# Patient Record
Sex: Female | Born: 1985 | Race: White | Hispanic: No | Smoking: Current every day smoker
Health system: Southern US, Community
[De-identification: ages and names within clinical notes are randomized; demographics above are authoritative.]

## PROBLEM LIST (undated history)

## (undated) DIAGNOSIS — R350 Frequency of micturition: Secondary | ICD-10-CM

## (undated) DIAGNOSIS — Z3490 Encounter for supervision of normal pregnancy, unspecified, unspecified trimester: Secondary | ICD-10-CM

## (undated) DIAGNOSIS — N390 Urinary tract infection, site not specified: Principal | ICD-10-CM

## (undated) DIAGNOSIS — F419 Anxiety disorder, unspecified: Secondary | ICD-10-CM

## (undated) DIAGNOSIS — R319 Hematuria, unspecified: Secondary | ICD-10-CM

## (undated) DIAGNOSIS — R300 Dysuria: Secondary | ICD-10-CM

## (undated) HISTORY — DX: Anxiety disorder, unspecified: F41.9

## (undated) HISTORY — DX: Hematuria, unspecified: R31.9

## (undated) HISTORY — DX: Urinary tract infection, site not specified: N39.0

## (undated) HISTORY — PX: NO PAST SURGERIES: SHX2092

## (undated) HISTORY — DX: Frequency of micturition: R35.0

## (undated) HISTORY — DX: Dysuria: R30.0

## (undated) HISTORY — DX: Encounter for supervision of normal pregnancy, unspecified, unspecified trimester: Z34.90

---
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
## 2009-03-17 ENCOUNTER — Emergency Department (HOSPITAL_COMMUNITY): Admission: EM | Admit: 2009-03-17 | Discharge: 2009-03-17 | Payer: Self-pay | Admitting: Emergency Medicine

## 2012-02-22 NOTE — L&D Delivery Note (Signed)
Delivery Note Pt pushed well and at 2:47 AM a viable female was delivered via Vaginal, Spontaneous Delivery (Presentation: Right Occiput Anterior).  APGAR: 9, 9; weight: pending.  Infant lifted to pt's abd and dried. Cord clamped and cut by FOB. Inspection revealed intact perineum/vag. Placenta status: Intact, Spontaneous.  Cord: 3 vessels with the following complications: None.   Anesthesia: Epidural  Episiotomy: None Lacerations: None Est. Blood Loss (mL): 200  Mom to postpartum.  Baby to Couplet care / Skin to Skin.  Stacy Booth 02/02/2013, 3:30 AM

## 2012-03-29 ENCOUNTER — Other Ambulatory Visit (HOSPITAL_COMMUNITY)
Admission: RE | Admit: 2012-03-29 | Discharge: 2012-03-29 | Disposition: A | Discharge status: Home / Self Care | Payer: Federal, State, Local not specified - PPO | Source: Ambulatory Visit | Attending: Obstetrics and Gynecology | Admitting: Obstetrics and Gynecology

## 2012-03-29 ENCOUNTER — Other Ambulatory Visit: Payer: Self-pay | Admitting: Adult Health

## 2012-03-29 DIAGNOSIS — Z01419 Encounter for gynecological examination (general) (routine) without abnormal findings: Secondary | ICD-10-CM | POA: Insufficient documentation

## 2012-06-11 ENCOUNTER — Other Ambulatory Visit: Payer: Self-pay | Admitting: Obstetrics & Gynecology

## 2012-06-11 ENCOUNTER — Encounter: Payer: Self-pay | Admitting: Obstetrics & Gynecology

## 2012-06-11 ENCOUNTER — Encounter: Payer: Self-pay | Admitting: Radiology

## 2012-06-11 ENCOUNTER — Ambulatory Visit (INDEPENDENT_AMBULATORY_CARE_PROVIDER_SITE_OTHER): Payer: Federal, State, Local not specified - PPO | Admitting: Obstetrics & Gynecology

## 2012-06-11 VITALS — BP 100/66 | Ht 63.0 in | Wt 162.0 lb

## 2012-06-11 DIAGNOSIS — O3680X0 Pregnancy with inconclusive fetal viability, not applicable or unspecified: Secondary | ICD-10-CM

## 2012-06-11 DIAGNOSIS — Z3201 Encounter for pregnancy test, result positive: Secondary | ICD-10-CM

## 2012-06-11 DIAGNOSIS — Z3200 Encounter for pregnancy test, result unknown: Secondary | ICD-10-CM

## 2012-06-11 LAB — POCT URINE PREGNANCY: Preg Test, Ur: POSITIVE

## 2012-06-15 ENCOUNTER — Encounter: Payer: Self-pay | Admitting: *Deleted

## 2012-06-18 ENCOUNTER — Ambulatory Visit (INDEPENDENT_AMBULATORY_CARE_PROVIDER_SITE_OTHER): Payer: Federal, State, Local not specified - PPO

## 2012-06-18 DIAGNOSIS — N926 Irregular menstruation, unspecified: Secondary | ICD-10-CM

## 2012-06-18 DIAGNOSIS — O3680X0 Pregnancy with inconclusive fetal viability, not applicable or unspecified: Secondary | ICD-10-CM

## 2012-06-18 NOTE — Progress Notes (Signed)
U/S -single IUP with +FCA noted, FHR=123BPM, CRL c/w 6+1wks EDD-02/10/2013, cx long and closed, bilateral adnexa wnl

## 2012-06-20 ENCOUNTER — Encounter: Payer: Federal, State, Local not specified - PPO | Admitting: Advanced Practice Midwife

## 2012-06-21 LAB — US OB COMP LESS 14 WKS

## 2012-06-28 ENCOUNTER — Encounter: Payer: Self-pay | Admitting: Advanced Practice Midwife

## 2012-06-28 ENCOUNTER — Ambulatory Visit: Payer: Federal, State, Local not specified - PPO | Admitting: Advanced Practice Midwife

## 2012-06-28 VITALS — BP 118/80 | Wt 161.0 lb

## 2012-06-28 DIAGNOSIS — Z3490 Encounter for supervision of normal pregnancy, unspecified, unspecified trimester: Secondary | ICD-10-CM

## 2012-06-28 DIAGNOSIS — Z3481 Encounter for supervision of other normal pregnancy, first trimester: Secondary | ICD-10-CM

## 2012-06-28 DIAGNOSIS — Z3400 Encounter for supervision of normal first pregnancy, unspecified trimester: Secondary | ICD-10-CM

## 2012-06-28 LAB — POCT URINALYSIS DIPSTICK
Glucose, UA: NEGATIVE
Ketones, UA: NEGATIVE
Nitrite, UA: NEGATIVE
Protein, UA: NEGATIVE

## 2012-06-28 LAB — OB RESULTS CONSOLE GC/CHLAMYDIA: Gonorrhea: NEGATIVE

## 2012-06-28 LAB — CBC
Hemoglobin: 12.2 g/dL (ref 12.0–15.0)
MCHC: 33.2 g/dL (ref 30.0–36.0)
RDW: 14.2 % (ref 11.5–15.5)

## 2012-06-28 LAB — HEPATITIS B SURFACE ANTIGEN: Hepatitis B Surface Ag: NEGATIVE

## 2012-06-28 LAB — RPR

## 2012-06-28 NOTE — Progress Notes (Signed)
  Subjective:    Stacy Booth is a G2P1001 [redacted]w[redacted]d being seen today for her first obstetrical visit.  Her obstetrical history is significant for nothing.  Pregnancy history fully reviewed.  Patient reports cramping, pain in right side, headaches.  May take tylenol  FHR normal on beside ultrasound  Filed Vitals:   06/28/12 1113  BP: 118/80  Weight: 161 lb (73.029 kg)    HISTORY: OB History   Grav Para Term Preterm Abortions TAB SAB Ect Mult Living   2 1 1       1      # Outc Date GA Lbr Len/2nd Wgt Sex Del Anes PTL Lv   1 TRM 2013 [redacted]w[redacted]d  8lb(3.629kg) M SVD   Yes   2 CUR              Past Medical History  Diagnosis Date  . Medical history non-contributory    Past Surgical History  Procedure Laterality Date  . No past surgeries     Family History  Problem Relation Age of Onset  . Diabetes Mother   . Schizophrenia Brother   . Bipolar disorder Brother   . Mental retardation Brother   . Bipolar disorder Maternal Aunt   . Depression Maternal Aunt   . Schizophrenia Maternal Grandmother   . Diabetes Maternal Grandmother   . Mental retardation Maternal Grandmother   . Diabetes Maternal Grandfather      Exam                                      System: Breast:  normal appearance, no masses or tenderness   Skin: normal coloration and turgor, no rashes    Neurologic: oriented, normal, normal mood   Extremities: normal strength, tone, and muscle mass   HEENT PERRLA   Mouth/Teeth mucous membranes moist, pharynx normal without lesions   Neck supple and no masses   Cardiovascular: regular rate and rhythm   Respiratory:  appears well, vitals normal, no respiratory distress, acyanotic, normal RR   Abdomen: soft, non-tender; bowel sounds normal; no masses,  no organomegaly.  Non tender.  Pain seems more intestinal          Assessment:    Pregnancy: G2P1001 Patient Active Problem List   Diagnosis Date Noted  . Pregnant 06/28/2012    Priority: Low         Plan:     Initial labs drawn. Prenatal vitamins. Problem list reviewed and updated. Genetic Screening discussed Integrated Screen: requested.  Ultrasound discussed; fetal survey: requested.  Follow up in 5 weeks.  CRESENZO-DISHMAN,Monte Bronder 06/28/2012

## 2012-06-28 NOTE — Progress Notes (Signed)
Cramps, headache, pain in right side. New OB packet given. Consents signed.

## 2012-06-29 LAB — DRUG SCREEN, URINE, NO CONFIRMATION
Barbiturate Quant, Ur: NEGATIVE
Cocaine Metabolites: NEGATIVE
Creatinine,U: 92.3 mg/dL
Opiate Screen, Urine: NEGATIVE

## 2012-06-29 LAB — URINALYSIS
Bilirubin Urine: NEGATIVE
Glucose, UA: NEGATIVE mg/dL
Hgb urine dipstick: NEGATIVE
Ketones, ur: NEGATIVE mg/dL
Leukocytes, UA: NEGATIVE
Nitrite: NEGATIVE
Protein, ur: NEGATIVE mg/dL
Specific Gravity, Urine: 1.02 (ref 1.005–1.030)
Urobilinogen, UA: 0.2 mg/dL (ref 0.0–1.0)
pH: 5.5 (ref 5.0–8.0)

## 2012-06-29 LAB — ABO AND RH: Rh Type: POSITIVE

## 2012-06-29 LAB — GC/CHLAMYDIA PROBE AMP: CT Probe RNA: NEGATIVE

## 2012-06-29 LAB — ANTIBODY SCREEN: Antibody Screen: NEGATIVE

## 2012-07-01 LAB — URINE CULTURE: Colony Count: 9000

## 2012-08-02 ENCOUNTER — Ambulatory Visit (INDEPENDENT_AMBULATORY_CARE_PROVIDER_SITE_OTHER): Payer: Federal, State, Local not specified - PPO

## 2012-08-02 ENCOUNTER — Encounter: Payer: Self-pay | Admitting: Advanced Practice Midwife

## 2012-08-02 ENCOUNTER — Other Ambulatory Visit: Payer: Self-pay | Admitting: Advanced Practice Midwife

## 2012-08-02 ENCOUNTER — Ambulatory Visit (INDEPENDENT_AMBULATORY_CARE_PROVIDER_SITE_OTHER): Payer: Federal, State, Local not specified - PPO | Admitting: Adult Health

## 2012-08-02 VITALS — BP 140/90 | Wt 162.0 lb

## 2012-08-02 DIAGNOSIS — Z3490 Encounter for supervision of normal pregnancy, unspecified, unspecified trimester: Secondary | ICD-10-CM

## 2012-08-02 DIAGNOSIS — Z3480 Encounter for supervision of other normal pregnancy, unspecified trimester: Secondary | ICD-10-CM

## 2012-08-02 DIAGNOSIS — Z3481 Encounter for supervision of other normal pregnancy, first trimester: Secondary | ICD-10-CM

## 2012-08-02 DIAGNOSIS — Z331 Pregnant state, incidental: Secondary | ICD-10-CM

## 2012-08-02 DIAGNOSIS — Z36 Encounter for antenatal screening of mother: Secondary | ICD-10-CM

## 2012-08-02 DIAGNOSIS — Z1389 Encounter for screening for other disorder: Secondary | ICD-10-CM

## 2012-08-02 LAB — POCT URINALYSIS DIPSTICK
Leukocytes, UA: NEGATIVE
Nitrite, UA: NEGATIVE
Protein, UA: NEGATIVE

## 2012-08-02 MED ORDER — BUTALBITAL-APAP-CAFFEINE 50-325-40 MG PO TABS: 1.0000 | ORAL_TABLET | Freq: Four times a day (QID) | ORAL | Status: DC | PRN

## 2012-08-02 NOTE — Progress Notes (Signed)
U/S PERFORMED, NT SCREEN, CRL=71MM, 13W 1D, NT 1.7MM, RECENT U/S EDC= 02-10-13, CX LONG AND CLOSED. BILAT OVS SEEN, MEAS C/W DATES, NML FLUID

## 2012-08-02 NOTE — Progress Notes (Signed)
Cramping

## 2012-08-02 NOTE — Progress Notes (Signed)
Had NT/IT today.  Cx long and closed.  Still having daily, unrelenting HA's.  Tylenol q 4 hours doesn't help.  Will rx Fioricet prn.  Routine questions about pregnancy answered.  F/U in 4 weeks for LROB/2nd IT.

## 2012-08-02 NOTE — Addendum Note (Signed)
Addended by: Cyril Mourning A on: 08/02/2012 04:21 PM   Modules accepted: Orders

## 2012-08-30 ENCOUNTER — Other Ambulatory Visit: Payer: Self-pay | Admitting: Advanced Practice Midwife

## 2012-08-30 ENCOUNTER — Encounter: Payer: Self-pay | Admitting: Advanced Practice Midwife

## 2012-08-30 ENCOUNTER — Ambulatory Visit (INDEPENDENT_AMBULATORY_CARE_PROVIDER_SITE_OTHER): Payer: Federal, State, Local not specified - PPO | Admitting: Advanced Practice Midwife

## 2012-08-30 VITALS — BP 118/72 | Wt 165.0 lb

## 2012-08-30 DIAGNOSIS — Z331 Pregnant state, incidental: Secondary | ICD-10-CM

## 2012-08-30 DIAGNOSIS — Z1389 Encounter for screening for other disorder: Secondary | ICD-10-CM

## 2012-08-30 DIAGNOSIS — Z3480 Encounter for supervision of other normal pregnancy, unspecified trimester: Secondary | ICD-10-CM

## 2012-08-30 LAB — POCT URINALYSIS DIPSTICK: Protein, UA: NEGATIVE

## 2012-08-30 NOTE — Progress Notes (Signed)
2nd IT today.    No c/o at this time.  Routine questions about pregnancy answered.  F/U in 3 weeks for anatomy scan.    

## 2012-09-02 LAB — MATERNAL SCREEN, INTEGRATED #2
AFP MoM: 1.48
Calculated Gestational Age: 17.1
Crown Rump Length: 71 mm
Inhibin A Dimeric: 175 pg/mL
MSS Trisomy 18 Risk: <1:5000 {titer}
NT MoM: 1.08
Nuchal Translucency: 1.7 mm
Number of fetuses: 1
PAPP-A MoM: 1.2
hCG, Serum: 49.7 IU/mL

## 2012-09-20 ENCOUNTER — Ambulatory Visit (INDEPENDENT_AMBULATORY_CARE_PROVIDER_SITE_OTHER): Payer: Federal, State, Local not specified - PPO

## 2012-09-20 ENCOUNTER — Other Ambulatory Visit: Payer: Self-pay | Admitting: Advanced Practice Midwife

## 2012-09-20 ENCOUNTER — Encounter: Payer: Self-pay | Admitting: Advanced Practice Midwife

## 2012-09-20 ENCOUNTER — Ambulatory Visit (INDEPENDENT_AMBULATORY_CARE_PROVIDER_SITE_OTHER): Payer: Federal, State, Local not specified - PPO | Admitting: Advanced Practice Midwife

## 2012-09-20 VITALS — BP 110/70 | Wt 161.0 lb

## 2012-09-20 DIAGNOSIS — Z363 Encounter for antenatal screening for malformations: Secondary | ICD-10-CM

## 2012-09-20 DIAGNOSIS — Z1389 Encounter for screening for other disorder: Secondary | ICD-10-CM

## 2012-09-20 DIAGNOSIS — Z3490 Encounter for supervision of normal pregnancy, unspecified, unspecified trimester: Secondary | ICD-10-CM

## 2012-09-20 DIAGNOSIS — Z3480 Encounter for supervision of other normal pregnancy, unspecified trimester: Secondary | ICD-10-CM

## 2012-09-20 LAB — POCT URINALYSIS DIPSTICK
Ketones, UA: NEGATIVE
Leukocytes, UA: NEGATIVE
Nitrite, UA: NEGATIVE
Protein, UA: NEGATIVE

## 2012-09-20 NOTE — Progress Notes (Signed)
U/S(19+4wks)-active fetus, meas c/w dates, fluid wnl, no major abnl noted, cx long and closed, bilateral adnexa wnl, ant Gr 0 plac, female fetus

## 2012-09-20 NOTE — Addendum Note (Signed)
Addended by: Richardson Chiquito on: 09/20/2012 12:30 PM   Modules accepted: Orders

## 2012-09-20 NOTE — Progress Notes (Signed)
Had anatomy scan today.    No c/o at this time.  Routine questions about pregnancy answered.  F/U in 4 weeks for LROB.

## 2012-09-20 NOTE — Progress Notes (Signed)
HAD U/S TODAY. 

## 2012-10-18 ENCOUNTER — Ambulatory Visit (INDEPENDENT_AMBULATORY_CARE_PROVIDER_SITE_OTHER): Payer: Federal, State, Local not specified - PPO | Admitting: Obstetrics & Gynecology

## 2012-10-18 ENCOUNTER — Encounter: Payer: Self-pay | Admitting: Obstetrics & Gynecology

## 2012-10-18 VITALS — BP 110/60 | Wt 168.0 lb

## 2012-10-18 DIAGNOSIS — Z1389 Encounter for screening for other disorder: Secondary | ICD-10-CM

## 2012-10-18 DIAGNOSIS — Z3480 Encounter for supervision of other normal pregnancy, unspecified trimester: Secondary | ICD-10-CM

## 2012-10-18 DIAGNOSIS — Z331 Pregnant state, incidental: Secondary | ICD-10-CM

## 2012-10-18 LAB — POCT URINALYSIS DIPSTICK
Leukocytes, UA: NEGATIVE
Nitrite, UA: NEGATIVE
Protein, UA: NEGATIVE

## 2012-10-18 NOTE — Progress Notes (Signed)
BP weight and urine results all reviewed and noted. Patient reports good fetal movement, denies any bleeding and no rupture of membranes symptoms or regular contractions. Patient is without complaints. All questions were answered.  

## 2012-10-18 NOTE — Patient Instructions (Addendum)
Breastfeeding A change in hormones during your pregnancy causes growth of your breast tissue and an increase in number and size of milk ducts. The hormone prolactin allows proteins, sugars, and fats from your blood supply to make breast milk in your milk-producing glands. The hormone progesterone prevents breast milk from being released before the birth of your baby. After the birth of your baby, your progesterone level decreases allowing breast milk to be released. Thoughts of your baby, as well as his or her sucking or crying, can stimulate the release of milk from the milk-producing glands. Deciding to breastfeed (nurse) is one of the best choices you can make for you and your baby. The information that follows gives a brief review of the benefits, as well as other important skills to know about breastfeeding. BENEFITS OF BREASTFEEDING For your baby  The first milk (colostrum) helps your baby's digestive system function better.   There are antibodies in your milk that help your baby fight off infections.   Your baby has a lower incidence of asthma, allergies, and sudden infant death syndrome (SIDS).   The nutrients in breast milk are better for your baby than infant formulas.  Breast milk improves your baby's brain development.   Your baby will have less gas, colic, and constipation.  Your baby is less likely to develop other conditions, such as childhood obesity, asthma, or diabetes mellitus. For you  Breastfeeding helps develop a very special bond between you and your baby.   Breastfeeding is convenient, always available at the correct temperature, and costs nothing.   Breastfeeding helps to burn calories and helps you lose the weight gained during pregnancy.   Breastfeeding makes your uterus contract back down to normal size faster and slows bleeding following delivery.   Breastfeeding mothers have a lower risk of developing osteoporosis or breast or ovarian cancer later  in life.  BREASTFEEDING FREQUENCY  A healthy, full-term baby may breastfeed as often as every hour or space his or her feedings to every 3 hours. Breastfeeding frequency will vary from baby to baby.   Newborns should be fed no less than every 2 3 hours during the day and every 4 5 hours during the night. You should breastfeed a minimum of 8 feedings in a 24 hour period.  Awaken your baby to breastfeed if it has been 3 4 hours since the last feeding.  Breastfeed when you feel the need to reduce the fullness of your breasts or when your newborn shows signs of hunger. Signs that your baby may be hungry include:  Increased alertness or activity.  Stretching.  Movement of the head from side to side.  Movement of the head and opening of the mouth when the corner of the mouth or cheek is stroked (rooting).  Increased sucking sounds, smacking lips, cooing, sighing, or squeaking.  Hand-to-mouth movements.  Increased sucking of fingers or hands.  Fussing.  Intermittent crying.  Signs of extreme hunger will require calming and consoling before you try to feed your baby. Signs of extreme hunger may include:  Restlessness.  A loud, strong cry.  Screaming.  Frequent feeding will help you make more milk and will help prevent problems, such as sore nipples and engorgement of the breasts.  BREASTFEEDING   Whether lying down or sitting, be sure that the baby's abdomen is facing your abdomen.   Support your breast with 4 fingers under your breast and your thumb above your nipple. Make sure your fingers are well away from   your nipple and your baby's mouth.   Stroke your baby's lips gently with your finger or nipple.   When your baby's mouth is open wide enough, place all of your nipple and as much of the colored area around your nipple (areola) as possible into your baby's mouth.  More areola should be visible above his or her upper lip than below his or her lower lip.  Your  baby's tongue should be between his or her lower gum and your breast.  Ensure that your baby's mouth is correctly positioned around the nipple (latched). Your baby's lips should create a seal on your breast.  Signs that your baby has effectively latched onto your nipple include:  Tugging or sucking without pain.  Swallowing heard between sucks.  Absent click or smacking sound.  Muscle movement above and in front of his or her ears with sucking.  Your baby must suck about 2 3 minutes in order to get your milk. Allow your baby to feed on each breast as long as he or she wants. Nurse your baby until he or she unlatches or falls asleep at the first breast, then offer the second breast.  Signs that your baby is full and satisfied include:  A gradual decrease in the number of sucks or complete cessation of sucking.  Falling asleep.  Extension or relaxation of his or her body.  Retention of a small amount of milk in his or her mouth.  Letting go of your breast by himself or herself.  Signs of effective breastfeeding in you include:  Breasts that have increased firmness, weight, and size prior to feeding.  Breasts that are softer after nursing.  Increased milk volume, as well as a change in milk consistency and color by the 5th day of breastfeeding.  Breast fullness relieved by breastfeeding.  Nipples are not sore, cracked, or bleeding.  If needed, break the suction by putting your finger into the corner of your baby's mouth and sliding your finger between his or her gums. Then, remove your breast from his or her mouth.  It is common for babies to spit up a small amount after a feeding.  Babies often swallow air during feeding. This can make babies fussy. Burping your baby between breasts can help with this.  Vitamin D supplements are recommended for babies who get only breast milk.  Avoid using a pacifier during your baby's first 4 6 weeks.  Avoid supplemental feedings of  water, formula, or juice in place of breastfeeding. Breast milk is all the food your baby needs. It is not necessary for your baby to have water or formula. Your breasts will make more milk if supplemental feedings are avoided during the early weeks. HOW TO TELL WHETHER YOUR BABY IS GETTING ENOUGH BREAST MILK Wondering whether or not your baby is getting enough milk is a common concern among mothers. You can be assured that your baby is getting enough milk if:   Your baby is actively sucking and you hear swallowing.   Your baby seems relaxed and satisfied after a feeding.   Your baby nurses at least 8 12 times in a 24 hour time period.  During the first 3 5 days of age:  Your baby is wetting at least 3 5 diapers in a 24 hour period. The urine should be clear and pale yellow.  Your baby is having at least 3 4 stools in a 24 hour period. The stool should be soft and yellow.  At   5 7 days of age, your baby is having at least 3 6 stools in a 24 hour period. The stool should be seedy and yellow by 5 days of age.  Your baby has a weight loss less than 7 10% during the first 3 days of age.  Your baby does not lose weight after 3 7 days of age.  Your baby gains 4 7 ounces each week after he or she is 4 days of age.  Your baby gains weight by 5 days of age and is back to birth weight within 2 weeks. ENGORGEMENT In the first week after your baby is born, you may experience extremely full breasts (engorgement). When engorged, your breasts may feel heavy, warm, or tender to the touch. Engorgement peaks within 24 48 hours after delivery of your baby.  Engorgement may be reduced by:  Continuing to breastfeed.  Increasing the frequency of breastfeeding.  Taking warm showers or applying warm, moist heat to your breasts just before each feeding. This increases circulation and helps the milk flow.   Gently massaging your breast before and during the feedings. With your fingertips, massage from  your chest wall towards your nipple in a circular motion.   Ensuring that your baby empties at least one breast at every feeding. It also helps to start the next feeding on the opposite breast.   Expressing breast milk by hand or by using a breast pump to empty the breasts if your baby is sleepy, or not nursing well. You may also want to express milk if you are returning to work oryou feel you are getting engorged.  Ensuring your baby is latched on and positioned properly while breastfeeding. If you follow these suggestions, your engorgement should improve in 24 48 hours. If you are still experiencing difficulty, call your lactation consultant or caregiver.  CARING FOR YOURSELF Take care of your breasts.  Bathe or shower daily.   Avoid using soap on your nipples.   Wear a supportive bra. Avoid wearing underwire style bras.  Air dry your nipples for a 3 4minutes after each feeding.   Use only cotton bra pads to absorb breast milk leakage. Leaking of breast milk between feedings is normal.   Use only pure lanolin on your nipples after nursing. You do not need to wash it off before feeding your baby again. Another option is to express a few drops of breast milk and gently massage that milk into your nipples.  Continue breast self-awareness checks. Take care of yourself.  Eat healthy foods. Alternate 3 meals with 3 snacks.  Avoid foods that you notice affect your baby in a bad way.  Drink milk, fruit juice, and water to satisfy your thirst (about 8 glasses a day).   Rest often, relax, and take your prenatal vitamins to prevent fatigue, stress, and anemia.  Avoid chewing and smoking tobacco.  Avoid alcohol and drug use.  Take over-the-counter and prescribed medicine only as directed by your caregiver or pharmacist. You should always check with your caregiver or pharmacist before taking any new medicine, vitamin, or herbal supplement.  Know that pregnancy is possible while  breastfeeding. If desired, talk to your caregiver about family planning and safe birth control methods that may be used while breastfeeding. SEEK MEDICAL CARE IF:   You feel like you want to stop breastfeeding or have become frustrated with breastfeeding.  You have painful breasts or nipples.  Your nipples are cracked or bleeding.  Your breasts are red, tender,   or warm.  You have a swollen area on either breast.  You have a fever or chills.  You have nausea or vomiting.  You have drainage from your nipples.  Your breasts do not become full before feedings by the 5th day after delivery.  You feel sad and depressed.  Your baby is too sleepy to eat well.  Your baby is having trouble sleeping.   Your baby is wetting less than 3 diapers in a 24 hour period.  Your baby has less than 3 stools in a 24 hour period.  Your baby's skin or the white part of his or her eyes becomes more yellow.   Your baby is not gaining weight by 5 days of age. MAKE SURE YOU:   Understand these instructions.  Will watch your condition.  Will get help right away if you are not doing well or get worse. Document Released: 02/07/2005 Document Revised: 11/02/2011 Document Reviewed: 09/14/2011 [employer] Patient Information 2014 [employer].  

## 2012-11-16 ENCOUNTER — Ambulatory Visit (INDEPENDENT_AMBULATORY_CARE_PROVIDER_SITE_OTHER): Payer: Federal, State, Local not specified - PPO | Admitting: Obstetrics & Gynecology

## 2012-11-16 ENCOUNTER — Encounter: Payer: Self-pay | Admitting: Obstetrics & Gynecology

## 2012-11-16 ENCOUNTER — Other Ambulatory Visit: Payer: Federal, State, Local not specified - PPO

## 2012-11-16 VITALS — BP 102/60 | Wt 179.0 lb

## 2012-11-16 DIAGNOSIS — Z331 Pregnant state, incidental: Secondary | ICD-10-CM

## 2012-11-16 DIAGNOSIS — Z3483 Encounter for supervision of other normal pregnancy, third trimester: Secondary | ICD-10-CM

## 2012-11-16 DIAGNOSIS — Z3482 Encounter for supervision of other normal pregnancy, second trimester: Secondary | ICD-10-CM

## 2012-11-16 DIAGNOSIS — O99019 Anemia complicating pregnancy, unspecified trimester: Secondary | ICD-10-CM

## 2012-11-16 DIAGNOSIS — Z1389 Encounter for screening for other disorder: Secondary | ICD-10-CM

## 2012-11-16 LAB — POCT URINALYSIS DIPSTICK
Glucose, UA: NEGATIVE
Ketones, UA: NEGATIVE
Leukocytes, UA: NEGATIVE

## 2012-11-16 LAB — CBC
MCHC: 34.3 g/dL (ref 30.0–36.0)
RDW: 13.6 % (ref 11.5–15.5)

## 2012-11-16 NOTE — Progress Notes (Signed)
BP weight and urine results all reviewed and noted. Patient reports good fetal movement, denies any bleeding and no rupture of membranes symptoms or regular contractions. Patient is without complaints. All questions were answered.  

## 2012-11-16 NOTE — Addendum Note (Signed)
Addended by: Colen Darling on: 11/16/2012 11:15 AM   Modules accepted: Orders

## 2012-11-16 NOTE — Progress Notes (Signed)
For PN2 Today. 

## 2012-11-17 LAB — HIV ANTIBODY (ROUTINE TESTING W REFLEX): HIV: NONREACTIVE

## 2012-11-17 LAB — ANTIBODY SCREEN: Antibody Screen: NEGATIVE

## 2012-11-19 LAB — HSV 2 ANTIBODY, IGG: HSV 2 Glycoprotein G Ab, IgG: <0.1 IV

## 2012-12-03 ENCOUNTER — Other Ambulatory Visit: Payer: Self-pay | Admitting: Adult Health

## 2012-12-03 MED ORDER — BUTALBITAL-APAP-CAFFEINE 50-325-40 MG PO TABS: 1.0000 | ORAL_TABLET | Freq: Four times a day (QID) | ORAL | Status: DC | PRN

## 2012-12-07 ENCOUNTER — Ambulatory Visit (INDEPENDENT_AMBULATORY_CARE_PROVIDER_SITE_OTHER): Payer: Federal, State, Local not specified - PPO | Admitting: Obstetrics and Gynecology

## 2012-12-07 VITALS — BP 118/60 | Wt 184.0 lb

## 2012-12-07 DIAGNOSIS — Z3490 Encounter for supervision of normal pregnancy, unspecified, unspecified trimester: Secondary | ICD-10-CM

## 2012-12-07 DIAGNOSIS — Z331 Pregnant state, incidental: Secondary | ICD-10-CM

## 2012-12-07 DIAGNOSIS — Z1389 Encounter for screening for other disorder: Secondary | ICD-10-CM

## 2012-12-07 DIAGNOSIS — O99019 Anemia complicating pregnancy, unspecified trimester: Secondary | ICD-10-CM

## 2012-12-07 LAB — POCT URINALYSIS DIPSTICK
Blood, UA: NEGATIVE
Ketones, UA: NEGATIVE
Nitrite, UA: NEGATIVE
Protein, UA: NEGATIVE

## 2012-12-07 NOTE — Progress Notes (Signed)
Pt is 30 weeks, 5 days and has been a low risk pregnancy thus far. She is unsure of contraception after birth. Leaning towards birth control pills. Discussed different contraception options including IUD. Provide pt with pamphlet for further reading. She expresses concern over taking Excedrin migraine due to ASA content. Pt states that she has put in a request for a Fioricet renewal. Addressed pt's concerns for possible placental bleeding due to ASA intake. Will have pt come for rechecks every two weeks

## 2012-12-07 NOTE — Progress Notes (Signed)
C/o back pain

## 2012-12-20 ENCOUNTER — Encounter: Payer: Federal, State, Local not specified - PPO | Admitting: Obstetrics & Gynecology

## 2012-12-24 ENCOUNTER — Ambulatory Visit (INDEPENDENT_AMBULATORY_CARE_PROVIDER_SITE_OTHER): Payer: Federal, State, Local not specified - PPO | Admitting: Obstetrics & Gynecology

## 2012-12-24 ENCOUNTER — Encounter: Payer: Self-pay | Admitting: Obstetrics & Gynecology

## 2012-12-24 VITALS — BP 110/80 | Wt 190.0 lb

## 2012-12-24 DIAGNOSIS — O2390 Unspecified genitourinary tract infection in pregnancy, unspecified trimester: Secondary | ICD-10-CM

## 2012-12-24 DIAGNOSIS — Z1389 Encounter for screening for other disorder: Secondary | ICD-10-CM

## 2012-12-24 DIAGNOSIS — N760 Acute vaginitis: Secondary | ICD-10-CM

## 2012-12-24 DIAGNOSIS — Z3483 Encounter for supervision of other normal pregnancy, third trimester: Secondary | ICD-10-CM

## 2012-12-24 DIAGNOSIS — O99019 Anemia complicating pregnancy, unspecified trimester: Secondary | ICD-10-CM

## 2012-12-24 LAB — POCT URINALYSIS DIPSTICK
Blood, UA: NEGATIVE
Ketones, UA: NEGATIVE
Nitrite, UA: NEGATIVE
Protein, UA: NEGATIVE

## 2012-12-24 MED ORDER — METRONIDAZOLE 0.75 % VA GEL: VAGINAL | Status: DC

## 2012-12-24 NOTE — Addendum Note (Signed)
Addended by: Richardson Chiquito on: 12/24/2012 04:19 PM   Modules accepted: Orders

## 2012-12-24 NOTE — Progress Notes (Signed)
+  BV on exam  metro[facility]el x 5 nights BP weight and urine results all reviewed and noted. Patient reports good fetal movement, denies any bleeding and no rupture of membranes symptoms or regular contractions. Patient is without complaints. All questions were answered.

## 2012-12-27 ENCOUNTER — Other Ambulatory Visit: Payer: Self-pay

## 2013-01-10 ENCOUNTER — Encounter: Payer: Self-pay | Admitting: Advanced Practice Midwife

## 2013-01-10 ENCOUNTER — Ambulatory Visit (INDEPENDENT_AMBULATORY_CARE_PROVIDER_SITE_OTHER): Payer: Federal, State, Local not specified - PPO | Admitting: Advanced Practice Midwife

## 2013-01-10 VITALS — BP 130/78 | Wt 199.0 lb

## 2013-01-10 DIAGNOSIS — Z3490 Encounter for supervision of normal pregnancy, unspecified, unspecified trimester: Secondary | ICD-10-CM

## 2013-01-10 DIAGNOSIS — Z1389 Encounter for screening for other disorder: Secondary | ICD-10-CM

## 2013-01-10 DIAGNOSIS — O99019 Anemia complicating pregnancy, unspecified trimester: Secondary | ICD-10-CM

## 2013-01-10 DIAGNOSIS — Z331 Pregnant state, incidental: Secondary | ICD-10-CM

## 2013-01-10 LAB — POCT URINALYSIS DIPSTICK
Blood, UA: NEGATIVE
Nitrite, UA: NEGATIVE
Protein, UA: NEGATIVE

## 2013-01-10 NOTE — Patient Instructions (Signed)

## 2013-01-10 NOTE — Progress Notes (Signed)
Vaginal pressure two days ago since resolved. Reassured pressure is normal at this point. Denies headache, blurred vision, epigastric pain, vaginal bleeding, and contractions. Will monitor BP. Info on gestation hypertension given. F/u in 1 week for BP check.

## 2013-01-16 ENCOUNTER — Ambulatory Visit (INDEPENDENT_AMBULATORY_CARE_PROVIDER_SITE_OTHER): Payer: Federal, State, Local not specified - PPO | Admitting: Obstetrics and Gynecology

## 2013-01-16 ENCOUNTER — Encounter: Payer: Self-pay | Admitting: Obstetrics and Gynecology

## 2013-01-16 VITALS — BP 138/78 | Wt 204.0 lb

## 2013-01-16 DIAGNOSIS — Z331 Pregnant state, incidental: Secondary | ICD-10-CM

## 2013-01-16 DIAGNOSIS — Z1389 Encounter for screening for other disorder: Secondary | ICD-10-CM

## 2013-01-16 DIAGNOSIS — IMO0002 Reserved for concepts with insufficient information to code with codable children: Secondary | ICD-10-CM

## 2013-01-16 DIAGNOSIS — O99019 Anemia complicating pregnancy, unspecified trimester: Secondary | ICD-10-CM

## 2013-01-16 DIAGNOSIS — Z3490 Encounter for supervision of normal pregnancy, unspecified, unspecified trimester: Secondary | ICD-10-CM | POA: Insufficient documentation

## 2013-01-16 DIAGNOSIS — Z3483 Encounter for supervision of other normal pregnancy, third trimester: Secondary | ICD-10-CM

## 2013-01-16 DIAGNOSIS — Z3493 Encounter for supervision of normal pregnancy, unspecified, third trimester: Secondary | ICD-10-CM

## 2013-01-16 LAB — POCT URINALYSIS DIPSTICK
Blood, UA: NEGATIVE
Glucose, UA: 1
Ketones, UA: NEGATIVE

## 2013-01-16 NOTE — Patient Instructions (Signed)
Fetal Movement Counts Patient Name: __________________________________________________ Patient Due Date: ____________________ Performing a fetal movement count is highly recommended in high-risk pregnancies, but it is good for every pregnant woman to do. Your caregiver may ask you to start counting fetal movements at 28 weeks of the pregnancy. Fetal movements often increase:  After eating a full meal.  After physical activity.  After eating or drinking something sweet or cold.  At rest. Pay attention to when you feel the baby is most active. This will help you notice a pattern of your baby's sleep and wake cycles and what factors contribute to an increase in fetal movement. It is important to perform a fetal movement count at the same time each day when your baby is normally most active.  HOW TO COUNT FETAL MOVEMENTS 1. Find a quiet and comfortable area to sit or lie down on your left side. Lying on your left side provides the best blood and oxygen circulation to your baby. 2. Write down the day and time on a sheet of paper or in a journal. 3. Start counting kicks, flutters, swishes, rolls, or jabs in a 2 hour period. You should feel at least 10 movements within 2 hours. 4. If you do not feel 10 movements in 2 hours, wait 2 3 hours and count again. Look for a change in the pattern or not enough counts in 2 hours. SEEK MEDICAL CARE IF:  You feel less than 10 counts in 2 hours, tried twice.  There is no movement in over an hour.  The pattern is changing or taking longer each day to reach 10 counts in 2 hours.  You feel the baby is not moving as he or she usually does. Date: ____________ Movements: ____________ Start time: ____________ Finish time: ____________  Date: ____________ Movements: ____________ Start time: ____________ Finish time: ____________ Date: ____________ Movements: ____________ Start time: ____________ Finish time: ____________ Date: ____________ Movements: ____________  Start time: ____________ Finish time: ____________ Date: ____________ Movements: ____________ Start time: ____________ Finish time: ____________ Date: ____________ Movements: ____________ Start time: ____________ Finish time: ____________ Date: ____________ Movements: ____________ Start time: ____________ Finish time: ____________ Date: ____________ Movements: ____________ Start time: ____________ Finish time: ____________  Date: ____________ Movements: ____________ Start time: ____________ Finish time: ____________ Date: ____________ Movements: ____________ Start time: ____________ Finish time: ____________ Date: ____________ Movements: ____________ Start time: ____________ Finish time: ____________ Date: ____________ Movements: ____________ Start time: ____________ Finish time: ____________ Date: ____________ Movements: ____________ Start time: ____________ Finish time: ____________ Date: ____________ Movements: ____________ Start time: ____________ Finish time: ____________ Date: ____________ Movements: ____________ Start time: ____________ Finish time: ____________  Date: ____________ Movements: ____________ Start time: ____________ Finish time: ____________ Date: ____________ Movements: ____________ Start time: ____________ Finish time: ____________ Date: ____________ Movements: ____________ Start time: ____________ Finish time: ____________ Date: ____________ Movements: ____________ Start time: ____________ Finish time: ____________ Date: ____________ Movements: ____________ Start time: ____________ Finish time: ____________ Date: ____________ Movements: ____________ Start time: ____________ Finish time: ____________ Date: ____________ Movements: ____________ Start time: ____________ Finish time: ____________  Date: ____________ Movements: ____________ Start time: ____________ Finish time: ____________ Date: ____________ Movements: ____________ Start time: ____________ Finish time:  ____________ Date: ____________ Movements: ____________ Start time: ____________ Finish time: ____________ Date: ____________ Movements: ____________ Start time: ____________ Finish time: ____________ Date: ____________ Movements: ____________ Start time: ____________ Finish time: ____________ Date: ____________ Movements: ____________ Start time: ____________ Finish time: ____________ Date: ____________ Movements: ____________ Start time: ____________ Finish time: ____________  Date: ____________ Movements: ____________ Start time: ____________ Finish   time: ____________ Date: ____________ Movements: ____________ Start time: ____________ Finish time: ____________ Date: ____________ Movements: ____________ Start time: ____________ Finish time: ____________ Date: ____________ Movements: ____________ Start time: ____________ Finish time: ____________ Date: ____________ Movements: ____________ Start time: ____________ Finish time: ____________ Date: ____________ Movements: ____________ Start time: ____________ Finish time: ____________ Date: ____________ Movements: ____________ Start time: ____________ Finish time: ____________  Date: ____________ Movements: ____________ Start time: ____________ Finish time: ____________ Date: ____________ Movements: ____________ Start time: ____________ Finish time: ____________ Date: ____________ Movements: ____________ Start time: ____________ Finish time: ____________ Date: ____________ Movements: ____________ Start time: ____________ Finish time: ____________ Date: ____________ Movements: ____________ Start time: ____________ Finish time: ____________ Date: ____________ Movements: ____________ Start time: ____________ Finish time: ____________ Date: ____________ Movements: ____________ Start time: ____________ Finish time: ____________  Date: ____________ Movements: ____________ Start time: ____________ Finish time: ____________ Date: ____________ Movements:  ____________ Start time: ____________ Finish time: ____________ Date: ____________ Movements: ____________ Start time: ____________ Finish time: ____________ Date: ____________ Movements: ____________ Start time: ____________ Finish time: ____________ Date: ____________ Movements: ____________ Start time: ____________ Finish time: ____________ Date: ____________ Movements: ____________ Start time: ____________ Finish time: ____________ Date: ____________ Movements: ____________ Start time: ____________ Finish time: ____________  Date: ____________ Movements: ____________ Start time: ____________ Finish time: ____________ Date: ____________ Movements: ____________ Start time: ____________ Finish time: ____________ Date: ____________ Movements: ____________ Start time: ____________ Finish time: ____________ Date: ____________ Movements: ____________ Start time: ____________ Finish time: ____________ Date: ____________ Movements: ____________ Start time: ____________ Finish time: ____________ Date: ____________ Movements: ____________ Start time: ____________ Finish time: ____________ Document Released: 03/09/2006 Document Revised: 01/25/2012 Document Reviewed: 12/05/2011 [employer] Patient Information 2014 [employer].  

## 2013-01-16 NOTE — Progress Notes (Signed)
Pt states that she is having pain and pressure. Pt denies any problems or concerns at this time.

## 2013-01-16 NOTE — Progress Notes (Signed)
Fetal movement continues , no bleeding or d/c Leg swollen , reflexes 1+,

## 2013-01-17 LAB — GC/CHLAMYDIA PROBE AMP: CT Probe RNA: NEGATIVE

## 2013-01-18 LAB — STREP B DNA PROBE: GBSP: NEGATIVE

## 2013-01-25 ENCOUNTER — Encounter: Payer: Self-pay | Admitting: Obstetrics & Gynecology

## 2013-01-25 ENCOUNTER — Ambulatory Visit (INDEPENDENT_AMBULATORY_CARE_PROVIDER_SITE_OTHER): Payer: Federal, State, Local not specified - PPO | Admitting: Obstetrics & Gynecology

## 2013-01-25 VITALS — BP 130/80 | Wt 198.0 lb

## 2013-01-25 DIAGNOSIS — O99019 Anemia complicating pregnancy, unspecified trimester: Secondary | ICD-10-CM

## 2013-01-25 DIAGNOSIS — Z331 Pregnant state, incidental: Secondary | ICD-10-CM

## 2013-01-25 DIAGNOSIS — Z1389 Encounter for screening for other disorder: Secondary | ICD-10-CM

## 2013-01-25 LAB — POCT URINALYSIS DIPSTICK
Glucose, UA: NEGATIVE
Ketones, UA: NEGATIVE
Leukocytes, UA: NEGATIVE
Nitrite, UA: NEGATIVE
Protein, UA: NEGATIVE

## 2013-01-25 NOTE — Progress Notes (Signed)
BP weight and urine results all reviewed and noted. Patient reports good fetal movement, denies any bleeding and no rupture of membranes symptoms or regular contractions. Patient is without complaints. All questions were answered.  

## 2013-02-01 ENCOUNTER — Inpatient Hospital Stay (HOSPITAL_COMMUNITY)
Admission: AD | Admit: 2013-02-01 | Discharge: 2013-02-03 | DRG: 775 | Disposition: A | Discharge status: Home / Self Care | Payer: Federal, State, Local not specified - PPO | Source: Ambulatory Visit | Attending: Obstetrics & Gynecology | Admitting: Obstetrics & Gynecology

## 2013-02-01 ENCOUNTER — Encounter: Payer: Federal, State, Local not specified - PPO | Admitting: Obstetrics & Gynecology

## 2013-02-01 ENCOUNTER — Inpatient Hospital Stay (HOSPITAL_COMMUNITY): Payer: Federal, State, Local not specified - PPO | Admitting: Anesthesiology

## 2013-02-01 ENCOUNTER — Encounter (HOSPITAL_COMMUNITY): Payer: Federal, State, Local not specified - PPO | Admitting: Anesthesiology

## 2013-02-01 ENCOUNTER — Encounter (HOSPITAL_COMMUNITY): Payer: Self-pay | Admitting: *Deleted

## 2013-02-01 DIAGNOSIS — R51 Headache: Secondary | ICD-10-CM | POA: Diagnosis not present

## 2013-02-01 DIAGNOSIS — O99893 Other specified diseases and conditions complicating puerperium: Principal | ICD-10-CM | POA: Diagnosis not present

## 2013-02-01 DIAGNOSIS — Z87891 Personal history of nicotine dependence: Secondary | ICD-10-CM

## 2013-02-01 DIAGNOSIS — Z3493 Encounter for supervision of normal pregnancy, unspecified, third trimester: Secondary | ICD-10-CM

## 2013-02-01 DIAGNOSIS — Z3490 Encounter for supervision of normal pregnancy, unspecified, unspecified trimester: Secondary | ICD-10-CM

## 2013-02-01 DIAGNOSIS — IMO0001 Reserved for inherently not codable concepts without codable children: Secondary | ICD-10-CM

## 2013-02-01 LAB — ABO/RH: ABO/RH(D): A POS

## 2013-02-01 LAB — CBC
MCHC: 34.4 g/dL (ref 30.0–36.0)
MCV: 86.6 fL (ref 78.0–100.0)
Platelets: 203 10*3/uL (ref 150–400)
RBC: 3.96 MIL/uL (ref 3.87–5.11)
RDW: 14 % (ref 11.5–15.5)
WBC: 12.9 10*3/uL — ABNORMAL HIGH (ref 4.0–10.5)

## 2013-02-01 LAB — TYPE AND SCREEN
ABO/RH(D): A POS
Antibody Screen: NEGATIVE

## 2013-02-01 MED ORDER — FENTANYL 2.5 MCG/ML BUPIVACAINE 1/10 % EPIDURAL INFUSION (WH - ANES)
14.0000 mL/h | INTRAMUSCULAR | Status: DC | PRN
  Administered 2013-02-02: 14 mL/h via EPIDURAL
  Filled 2013-02-01 (×4): qty 125

## 2013-02-01 MED ORDER — DIPHENHYDRAMINE HCL 50 MG/ML IJ SOLN: 12.5000 mg | INTRAMUSCULAR | Status: DC | PRN

## 2013-02-01 MED ORDER — EPHEDRINE 5 MG/ML INJ: 10.0000 mg | INTRAVENOUS | Status: DC | PRN

## 2013-02-01 MED ORDER — OXYTOCIN BOLUS FROM INFUSION
500.0000 mL | INTRAVENOUS | Status: DC
  Administered 2013-02-02: 500 mL via INTRAVENOUS

## 2013-02-01 MED ORDER — CITRIC ACID-SODIUM CITRATE 334-500 MG/5ML PO SOLN
30.0000 mL | ORAL | Status: DC | PRN
  Administered 2013-02-01: 30 mL via ORAL
  Filled 2013-02-01: qty 15

## 2013-02-01 MED ORDER — ACETAMINOPHEN 325 MG PO TABS
650.0000 mg | ORAL_TABLET | ORAL | Status: DC | PRN
  Administered 2013-02-01: 650 mg via ORAL
  Filled 2013-02-01: qty 2

## 2013-02-01 MED ORDER — ONDANSETRON HCL 4 MG/2ML IJ SOLN: 4.0000 mg | Freq: Four times a day (QID) | INTRAMUSCULAR | Status: DC | PRN

## 2013-02-01 MED ORDER — LACTATED RINGERS IV SOLN
INTRAVENOUS | Status: DC
  Administered 2013-02-01 (×3): via INTRAVENOUS

## 2013-02-01 MED ORDER — IBUPROFEN 600 MG PO TABS: 600.0000 mg | ORAL_TABLET | Freq: Four times a day (QID) | ORAL | Status: DC | PRN

## 2013-02-01 MED ORDER — OXYTOCIN 40 UNITS IN LACTATED RINGERS INFUSION - SIMPLE MED: 62.5000 mL/h | INTRAVENOUS | Status: DC

## 2013-02-01 MED ORDER — LIDOCAINE HCL (PF) 1 % IJ SOLN
30.0000 mL | INTRAMUSCULAR | Status: DC | PRN
  Filled 2013-02-01: qty 30

## 2013-02-01 MED ORDER — PHENYLEPHRINE 40 MCG/ML (10ML) SYRINGE FOR IV PUSH (FOR BLOOD PRESSURE SUPPORT)
80.0000 ug | PREFILLED_SYRINGE | INTRAVENOUS | Status: DC | PRN
  Filled 2013-02-01: qty 10

## 2013-02-01 MED ORDER — LIDOCAINE HCL (PF) 1 % IJ SOLN
INTRAMUSCULAR | Status: DC | PRN
  Administered 2013-02-01 (×2): 4 mL

## 2013-02-01 MED ORDER — OXYCODONE-ACETAMINOPHEN 5-325 MG PO TABS
1.0000 | ORAL_TABLET | ORAL | Status: DC | PRN
  Administered 2013-02-02: 1 via ORAL
  Filled 2013-02-01: qty 1

## 2013-02-01 MED ORDER — LACTATED RINGERS IV SOLN
500.0000 mL | Freq: Once | INTRAVENOUS | Status: AC
  Administered 2013-02-01: 500 mL via INTRAVENOUS

## 2013-02-01 MED ORDER — PHENYLEPHRINE 40 MCG/ML (10ML) SYRINGE FOR IV PUSH (FOR BLOOD PRESSURE SUPPORT): 80.0000 ug | PREFILLED_SYRINGE | INTRAVENOUS | Status: DC | PRN

## 2013-02-01 MED ORDER — TERBUTALINE SULFATE 1 MG/ML IJ SOLN: 0.2500 mg | Freq: Once | INTRAMUSCULAR | Status: AC | PRN

## 2013-02-01 MED ORDER — FLEET ENEMA 7-19 GM/118ML RE ENEM: 1.0000 | ENEMA | RECTAL | Status: DC | PRN

## 2013-02-01 MED ORDER — LACTATED RINGERS IV SOLN: 500.0000 mL | INTRAVENOUS | Status: DC | PRN

## 2013-02-01 MED ORDER — EPHEDRINE 5 MG/ML INJ
10.0000 mg | INTRAVENOUS | Status: DC | PRN
  Filled 2013-02-01: qty 4

## 2013-02-01 MED ORDER — FENTANYL 2.5 MCG/ML BUPIVACAINE 1/10 % EPIDURAL INFUSION (WH - ANES)
INTRAMUSCULAR | Status: DC | PRN
  Administered 2013-02-01: 14 mL/h via EPIDURAL
  Administered 2013-02-01: 20:00:00

## 2013-02-01 MED ORDER — OXYTOCIN 40 UNITS IN LACTATED RINGERS INFUSION - SIMPLE MED
1.0000 m[IU]/min | INTRAVENOUS | Status: DC
  Administered 2013-02-01: 2 m[IU]/min via INTRAVENOUS
  Filled 2013-02-01: qty 1000

## 2013-02-01 NOTE — Progress Notes (Signed)
Stacy Booth is a 27 y.o. G2P1001 at [redacted]w[redacted]d admitted for active labor, rupture of membranes  Subjective: She is comfortable on epidural and not feeling much pressure or pain with contractions. She complains of a mild headache that started after epidural. No vision changes, dizziness.  Objective: BP 122/58  Pulse 95  Temp(Src) 98.1 F (36.7 C) (Oral)  Resp 20  Ht 5\' 3"  (1.6 m)  Wt 89.812 kg (198 lb)  BMI 35.08 kg/m2  SpO2 98%   FHT:  FHR: 140 bpm, variability: moderate,  accelerations:  Present,  decelerations:  Absent UC:   regular, every 2-4 minutes SVE:   Dilation: 8 Effacement (%): 90 Station: -1 Exam by:: Anuar Walgren, MD  Labs: Lab Results  Component Value Date   WBC 12.9* 02/01/2013   HGB 11.8* 02/01/2013   HCT 34.3* 02/01/2013   MCV 86.6 02/01/2013   PLT 203 02/01/2013    Assessment / Plan: Spontaneous labor, progressing normally  Labor: Progressing normally Preeclampsia:  n/a Fetal Wellbeing:  Category I Pain Control:  Epidural I/D:  GBS neg Anticipated MOD:  NSVD  Jamerius Boeckman, Jearld Lesch 02/01/2013, 6:47 PM

## 2013-02-01 NOTE — H&P (Signed)
Stacy Booth is a 27 y.o. female G2P1001 with IUP at [redacted]w[redacted]d presenting for onset of labor. Pt states she has been having regular, every 5 minutes contractions since 0700, associated with none vaginal bleeding.  Membranes are spontaneously ruptured since 0600, clear fluid, sterile speculum exam with positive pooling and cough test, with active fetal movement.  Denies headache, vision changes, increased swelling of limbs/face, RUQ pain, chest pain, shortness of breath, palpitations, nausea/vomiting. Last ate last night, had some water prior to coming to [facility]. [facility]  Prenatal History/Complications: None  Past Medical History: Past Medical History  Diagnosis Date  . Medical history non-contributory     Past Surgical History: Past Surgical History  Procedure Laterality Date  . No past surgeries      Obstetrical History: OB History   Grav Para Term Preterm Abortions TAB SAB Ect Mult Living   2 1 1       1       Social History: History   Social History  . Marital Status: Single    Spouse Name: N/A    Number of Children: N/A  . Years of Education: N/A   Social History Main Topics  . Smoking status: Former Smoker    Types: Cigarettes  . Smokeless tobacco: Never Used  . Alcohol Use: No  . Drug Use: No  . Sexual Activity: Yes    Birth Control/ Protection: None   Other Topics Concern  . None   Social History Narrative  . None    Family History: Family History  Problem Relation Age of Onset  . Diabetes Mother   . Schizophrenia Brother   . Bipolar disorder Brother   . Mental retardation Brother   . Bipolar disorder Maternal Aunt   . Depression Maternal Aunt   . Schizophrenia Maternal Grandmother   . Diabetes Maternal Grandmother   . Mental retardation Maternal Grandmother   . Diabetes Maternal Grandfather     Allergies: No Known Allergies  Prescriptions prior to admission  Medication Sig Dispense Refill  . acetaminophen (TYLENOL) 325 MG  tablet Take 650 mg by mouth 2 (two) times daily.      . Prenatal Vit-Fe Fumarate-FA (MULTIVITAMIN-PRENATAL) 27-0.8 MG TABS Take 1 tablet by mouth daily at 12 noon.         Review of Systems  All systems reviewed and negative except as stated in HPI  Blood pressure 128/70, pulse 88, temperature 98 F (36.7 C), temperature source Oral, resp. rate 20, height 5\' 3"  (1.6 m), weight 89.812 kg (198 lb). General appearance: alert, cooperative and mild distress Lungs: clear to auscultation bilaterally Heart: regular rate and rhythm Abdomen: soft, non-tender; bowel sounds normal Pelvic: external exam within normal limits, + pooling with mild bloody show Extremities: Homans sign is negative, no sign of DVT DTR's 2+ Presentation: cephalic Fetal monitoringBaseline: 145 bpm, Variability: Good {> 6 bpm), Accelerations: Reactive and Decelerations: Absent Uterine activityFrequency: Every 3-5 minutes, Duration: 45-60 seconds and Intensity: moderate Dilation: 5 Effacement (%): 80 Station: -2 Exam by:: Shanda Bumps Pior / Sharen Counter, CNM   Prenatal labs: ABO, Rh: A/POS/-- (05/08 1151) Antibody: NEG (09/26 0913) Rubella:  Immune RPR: NON REAC (09/26 0913)  HBsAg: NEGATIVE (05/08 1151)  HIV: NON REACTIVE (09/26 0913)  GBS: NEGATIVE (11/26 1109)  1 hr Glucola: 81 Genetic screening  Normal Anatomy US Normal  Assessment: Stacy Booth is a 27 y.o. G2P1001 with an IUP at [redacted]w[redacted]d presenting for SROM and active labor at  term  Plan: Admit to L&D for active labor at term A+, GBS -, Rubella immune  Planning for epidural  Pior, Jearld Lesch, MD 02/01/2013, 11:59 AM  I have seen this patient and agree with the above resident's note.  LEFTWICH-KIRBY, Rosella Crandell Certified Nurse-Midwife

## 2013-02-01 NOTE — Progress Notes (Signed)
Stacy Booth is a 27 y.o. G2P1001 at [redacted]w[redacted]d admitted for active labor, rupture of membranes  Subjective: Pt now has epidural and only feels some pressure with contractions. Complains of some itching, but okay without medication for now. No headache, vision changes, swelling face/hands.  Objective: BP 138/67  Pulse 90  Temp(Src) 97.9 F (36.6 C) (Axillary)  Resp 18  Ht 5\' 3"  (1.6 m)  Wt 89.812 kg (198 lb)  BMI 35.08 kg/m2  SpO2 98%    FHT:  FHR: 135 bpm, variability: moderate,  accelerations:  Abscent,  decelerations:  Absent UC:   regular, every 5-6 minutes SVE:   Dilation: 5.5 Effacement (%): 80 Station: -2;-1 Exam by:: Renaldo Harrison, RN   Labs: Lab Results  Component Value Date   WBC 12.9* 02/01/2013   HGB 11.8* 02/01/2013   HCT 34.3* 02/01/2013   MCV 86.6 02/01/2013   PLT 203 02/01/2013    Assessment / Plan: Spontaneous labor, progressing normally  Labor: Progressing normally Preeclampsia:  no signs/symptoms, will continue monitoring BP Fetal Wellbeing:  Category I Pain Control:  Epidural I/D:  n/a Anticipated MOD:  NSVD  Dalilah Curlin, Jearld Lesch 02/01/2013, 3:02 PM

## 2013-02-01 NOTE — Anesthesia Preprocedure Evaluation (Signed)
Anesthesia Evaluation  Patient identified by MRN, date of birth, ID band Patient awake    Reviewed: Allergy & Precautions, H&P , Patient's Chart, lab work & pertinent test results  Airway Mallampati: III TM Distance: >3 FB Neck ROM: Full    Dental no notable dental hx. (+) Teeth Intact   Pulmonary former smoker,  breath sounds clear to auscultation  Pulmonary exam normal       Cardiovascular negative cardio ROS  Rhythm:Regular Rate:Normal     Neuro/Psych negative neurological ROS  negative psych ROS   GI/Hepatic negative GI ROS, Neg liver ROS,   Endo/Other  Obesity  Renal/GU negative Renal ROS  negative genitourinary   Musculoskeletal negative musculoskeletal ROS (+)   Abdominal (+) + obese,   Peds  Hematology negative hematology ROS (+)   Anesthesia Other Findings   Reproductive/Obstetrics (+) Pregnancy                           Anesthesia Physical Anesthesia Plan  ASA: II  Anesthesia Plan: Epidural   Post-op Pain Management:    Induction:   Airway Management Planned: Natural Airway  Additional Equipment:   Intra-op Plan:   Post-operative Plan:   Informed Consent: I have reviewed the patients History and Physical, chart, labs and discussed the procedure including the risks, benefits and alternatives for the proposed anesthesia with the patient or authorized representative who has indicated his/her understanding and acceptance.   Dental advisory given  Plan Discussed with: Anesthesiologist  Anesthesia Plan Comments:         Anesthesia Quick Evaluation

## 2013-02-01 NOTE — MAU Note (Signed)
Patient presents with complaints of contractons since 0600 and ? Rupture of membranes at 0700 today.

## 2013-02-01 NOTE — Anesthesia Procedure Notes (Signed)
Epidural Patient location during procedure: OB Start time: 02/01/2013 1:53 PM  Staffing Anesthesiologist: Guled Gahan A. Performed by: anesthesiologist   Preanesthetic Checklist Completed: patient identified, site marked, surgical consent, pre-op evaluation, timeout performed, IV checked, risks and benefits discussed and monitors and equipment checked  Epidural Patient position: sitting Prep: site prepped and draped and DuraPrep Patient monitoring: continuous pulse ox and blood pressure Approach: midline Injection technique: LOR air  Needle:  Needle type: Tuohy  Needle gauge: 17 G Needle length: 9 cm and 9 Needle insertion depth: 5 cm cm Catheter type: closed end flexible Catheter size: 19 Gauge Catheter at skin depth: 10 cm Test dose: negative and Other  Assessment Events: blood not aspirated, injection not painful, no injection resistance, negative IV test and no paresthesia  Additional Notes Patient identified. Risks and benefits discussed including failed block, incomplete  Pain control, post dural puncture headache, nerve damage, paralysis, blood pressure Changes, nausea, vomiting, reactions to medications-both toxic and allergic and post Partum back pain. All questions were answered. Patient expressed understanding and wished to proceed. Sterile technique was used throughout procedure. Epidural site was Dressed with sterile barrier dressing. No paresthesias, signs of intravascular injection Or signs of intrathecal spread were encountered.  Patient was more comfortable after the epidural was dosed. Please see RN's note for documentation of vital signs and FHR which are stable.

## 2013-02-02 ENCOUNTER — Encounter (HOSPITAL_COMMUNITY): Payer: Self-pay | Admitting: *Deleted

## 2013-02-02 DIAGNOSIS — O99893 Other specified diseases and conditions complicating puerperium: Secondary | ICD-10-CM

## 2013-02-02 DIAGNOSIS — R51 Headache: Secondary | ICD-10-CM

## 2013-02-02 DIAGNOSIS — O9989 Other specified diseases and conditions complicating pregnancy, childbirth and the puerperium: Secondary | ICD-10-CM

## 2013-02-02 DIAGNOSIS — Z87891 Personal history of nicotine dependence: Secondary | ICD-10-CM

## 2013-02-02 MED ORDER — IBUPROFEN 600 MG PO TABS
600.0000 mg | ORAL_TABLET | Freq: Four times a day (QID) | ORAL | Status: DC
  Administered 2013-02-02 – 2013-02-03 (×6): 600 mg via ORAL
  Filled 2013-02-02 (×6): qty 1

## 2013-02-02 MED ORDER — ZOLPIDEM TARTRATE 5 MG PO TABS: 5.0000 mg | ORAL_TABLET | Freq: Every evening | ORAL | Status: DC | PRN

## 2013-02-02 MED ORDER — SENNOSIDES-DOCUSATE SODIUM 8.6-50 MG PO TABS
2.0000 | ORAL_TABLET | ORAL | Status: DC
  Administered 2013-02-02: 2 via ORAL
  Filled 2013-02-02: qty 2

## 2013-02-02 MED ORDER — DIBUCAINE 1 % RE OINT
1.0000 "application " | TOPICAL_OINTMENT | RECTAL | Status: DC | PRN
  Filled 2013-02-02: qty 28

## 2013-02-02 MED ORDER — ONDANSETRON HCL 4 MG/2ML IJ SOLN: 4.0000 mg | INTRAMUSCULAR | Status: DC | PRN

## 2013-02-02 MED ORDER — DIPHENHYDRAMINE HCL 25 MG PO CAPS: 25.0000 mg | ORAL_CAPSULE | Freq: Four times a day (QID) | ORAL | Status: DC | PRN

## 2013-02-02 MED ORDER — LANOLIN HYDROUS EX OINT: TOPICAL_OINTMENT | CUTANEOUS | Status: DC | PRN

## 2013-02-02 MED ORDER — OXYCODONE-ACETAMINOPHEN 5-325 MG PO TABS
1.0000 | ORAL_TABLET | ORAL | Status: DC | PRN
  Administered 2013-02-02: 2 via ORAL
  Administered 2013-02-02: 1 via ORAL
  Administered 2013-02-02 (×2): 2 via ORAL
  Administered 2013-02-03: 1 via ORAL
  Administered 2013-02-03: 2 via ORAL
  Filled 2013-02-02: qty 2
  Filled 2013-02-02 (×2): qty 1
  Filled 2013-02-02: qty 2
  Filled 2013-02-02: qty 1
  Filled 2013-02-02: qty 2

## 2013-02-02 MED ORDER — TETANUS-DIPHTH-ACELL PERTUSSIS 5-2.5-18.5 LF-MCG/0.5 IM SUSP
0.5000 mL | Freq: Once | INTRAMUSCULAR | Status: AC
  Administered 2013-02-03: 0.5 mL via INTRAMUSCULAR
  Filled 2013-02-02: qty 0.5

## 2013-02-02 MED ORDER — ONDANSETRON HCL 4 MG PO TABS: 4.0000 mg | ORAL_TABLET | ORAL | Status: DC | PRN

## 2013-02-02 MED ORDER — BENZOCAINE-MENTHOL 20-0.5 % EX AERO
1.0000 "application " | INHALATION_SPRAY | CUTANEOUS | Status: DC | PRN
  Filled 2013-02-02: qty 56

## 2013-02-02 MED ORDER — SIMETHICONE 80 MG PO CHEW: 80.0000 mg | CHEWABLE_TABLET | ORAL | Status: DC | PRN

## 2013-02-02 MED ORDER — PRENATAL MULTIVITAMIN CH
1.0000 | ORAL_TABLET | Freq: Every day | ORAL | Status: DC
  Administered 2013-02-02 – 2013-02-03 (×2): 1 via ORAL
  Filled 2013-02-02 (×2): qty 1

## 2013-02-02 MED ORDER — WITCH HAZEL-GLYCERIN EX PADS: 1.0000 "application " | MEDICATED_PAD | CUTANEOUS | Status: DC | PRN

## 2013-02-02 NOTE — Progress Notes (Signed)
Stacy Booth is a 27 y.o. G2P1001 at [redacted]w[redacted]d   Subjective: Comfortable with epidural; occ mild pressure  Objective: BP 125/60  Pulse 89  Temp(Src) 98.2 F (36.8 C) (Oral)  Resp 16  Ht 5\' 3"  (1.6 m)  Wt 89.812 kg (198 lb)  BMI 35.08 kg/m2  SpO2 98%      FHT:  FHR: 130s bpm, variability: moderate,  accelerations:  Present,  decelerations:  Absent UC:   irregular, every 3-5 minutes with Pitocin at 8 mu/min (being increased as freq as possible)- MVUs 85 mmhg SVE:   Dilation: 8 Effacement (%): 90 Station: -1 Exam by:: Dr.Dove- cx not reexamined due to inadequate MVUs  Labs: Lab Results  Component Value Date   WBC 12.9* 02/01/2013   HGB 11.8* 02/01/2013   HCT 34.3* 02/01/2013   MCV 86.6 02/01/2013   PLT 203 02/01/2013    Assessment / Plan: Active labor- inadequate ctx  Will continue to increase Pitocin to achieve adequate labor Plan to reexamine after MVUs adequate x 1-2 hrs   SHAW, KIMBERLY 02/02/2013, 12:33 AM

## 2013-02-02 NOTE — Anesthesia Postprocedure Evaluation (Signed)
  Anesthesia Post-op Note  Patient: Stacy Booth  Procedure(s) Performed: * No procedures listed *  Patient Location: Mother/Baby  Anesthesia Type:Epidural  Level of Consciousness: awake  Airway and Oxygen Therapy: Patient Spontanous Breathing  Post-op Pain: mild  Post-op Assessment: Patient's Cardiovascular Status Stable and Respiratory Function Stable  Post-op Vital Signs: stable  Complications: No apparent anesthesia complications

## 2013-02-03 MED ORDER — OXYCODONE-ACETAMINOPHEN 5-325 MG PO TABS: 1.0000 | ORAL_TABLET | ORAL | Status: DC | PRN

## 2013-02-03 MED ORDER — IBUPROFEN 600 MG PO TABS: 600.0000 mg | ORAL_TABLET | Freq: Four times a day (QID) | ORAL | Status: DC | PRN

## 2013-02-03 NOTE — Progress Notes (Signed)
Given Rx for 20 Percocet, pt has been requiring Rx for ADL while in hospital.

## 2013-02-03 NOTE — Lactation Note (Addendum)
This note was copied from the chart of Boy Hildur Bayer. Lactation Consultation Note  Patient Name: Boy Jule Whitsel [id number] Date: 02/03/2013 Reason for consult: Initial assessment Per mom breast fed 1st baby 16 months , and breast feeding is going well with this baby . Baby presently latched with depth and mom comfortable. Baby has been consistent with feedings. Noted multiply swallows. Reviewed sore nipple and engorgement prevention and tx , referring to baby  and me booklet [id number]. Mom aware of the [facility] and the [facility] O/P services.    Maternal Data Formula Feeding for Exclusion: No Has patient been taught Hand Expression?: Yes Does the patient have breastfeeding experience prior to this delivery?: Yes  Feeding Feeding Type:  (baby latched ) Length of feed: 15 min  LATCH Score/Interventions                Intervention(s): Breastfeeding basics reviewed     Lactation Tools Discussed/Used Tools:  (per mom has a DEBP at home ) [facility] Program: Yes (per mom [place] ) Pump Review: Setup, frequency, and cleaning;Milk Storage   Consult Status Consult Status: Complete    Kathrin Greathouse 02/03/2013, 1:43 PM

## 2013-02-03 NOTE — Discharge Summary (Signed)
Obstetric Discharge Summary Reason for Admission: onset of labor and rupture of membranes Prenatal Procedures: ultrasound Intrapartum Procedures: spontaneous vaginal delivery Postpartum Procedures: none Complications-Operative and Postpartum: none Eating, drinking, voiding, ambulating well.  +flatus.  Lochia and pain wnl.  Has occasional pain that radiates to tailbone. Cramping when BF. Denies dizziness, lightheadedness, or sob. No complaints. Desires d/c today.   Hemoglobin  Date Value Range Status  02/01/2013 11.8* 12.0 - 15.0 g/dL Final     HCT  Date Value Range Status  02/01/2013 34.3* 36.0 - 46.0 % Final    Physical Exam:  General: alert, cooperative and no distress Lochia: appropriate Uterine Fundus: firm Incision: n/a DVT Evaluation: No evidence of DVT seen on physical exam. Negative Homan's sign. No cords or calf tenderness. No significant calf/ankle edema.  Discharge Diagnoses: Term Pregnancy-delivered  Discharge Information: Date: 02/03/2013 Activity: pelvic rest Diet: routine Medications: PNV and Ibuprofen Condition: stable Instructions: refer to practice specific booklet Discharge to: home Follow-up Information   Schedule an appointment as soon as possible for a visit with [facility]. (ASAP for your son's circumcision. 4-6 weeks for your postpartum visit)    Specialty:  Obstetrics and Gynecology   Contact information:   [address] [place] [address] [phone]      Newborn Data: Live born female  Birth Weight: 9 lb 4.2 oz (4200 g) APGAR: 9, 9  Home with mother. Breastfeeding, plans micronor for contraception, circumcision at FT To use donut pillow if needed for coccyx pain.   Marge Duncans 02/03/2013, 7:30 AM

## 2013-02-03 NOTE — Discharge Summary (Signed)
Attestation of Attending Supervision of Advanced Practitioner (PA/CNM/NP): Evaluation and management procedures were performed by the Advanced Practitioner under my supervision and collaboration.  I have reviewed the Advanced Practitioner's note and chart, and I agree with the management and plan.  Anagabriela Jokerst, MD, FACOG Attending Obstetrician & Gynecologist Faculty Practice, [facility]  

## 2013-03-18 ENCOUNTER — Encounter: Payer: Self-pay | Admitting: Women's Health

## 2013-03-18 ENCOUNTER — Ambulatory Visit (INDEPENDENT_AMBULATORY_CARE_PROVIDER_SITE_OTHER): Payer: Federal, State, Local not specified - PPO | Admitting: Women's Health

## 2013-03-18 VITALS — BP 124/70 | Ht 63.0 in | Wt 174.0 lb

## 2013-03-18 DIAGNOSIS — Z309 Encounter for contraceptive management, unspecified: Secondary | ICD-10-CM

## 2013-03-18 DIAGNOSIS — Z3202 Encounter for pregnancy test, result negative: Secondary | ICD-10-CM

## 2013-03-18 DIAGNOSIS — Z3480 Encounter for supervision of other normal pregnancy, unspecified trimester: Secondary | ICD-10-CM

## 2013-03-18 LAB — POCT URINE PREGNANCY: Preg Test, Ur: NEGATIVE

## 2013-03-18 MED ORDER — NORETHINDRONE 0.35 MG PO TABS: 1.0000 | ORAL_TABLET | Freq: Every day | ORAL | Status: DC

## 2013-03-18 NOTE — Patient Instructions (Signed)
Norethindrone tablets (contraception)- Micronor What is this medicine? NORETHINDRONE (nor eth IN drone) is an oral contraceptive. The product contains a female hormone known as a progestin. It is used to prevent pregnancy. This medicine may be used for other purposes; ask your health care provider or pharmacist if you have questions. COMMON BRAND NAME(S): Camila, [place] , [place], [place], [place] , [place], Nor-QD, Nora-BE, Orth[facility]ronor What should I tell my health care provider before I take this medicine? They need to know if you have any of these conditions: -blood vessel disease or blood clots -breast, cervical, or vaginal cancer -diabetes -heart disease -kidney disease -liver disease -mental depression -migraine -seizures -stroke -vaginal bleeding -an unusual or allergic reaction to norethindrone, other medicines, foods, dyes, or preservatives -pregnant or trying to get pregnant -breast-feeding How should I use this medicine? Take this medicine by mouth with a glass of water. You may take it with or without food. Follow the directions on the prescription label. Take this medicine at the same time each day and in the order directed on the package. Do not take your medicine more often than directed. Contact your pediatrician regarding the use of this medicine in children. Special care may be needed. This medicine has been used in female children who have started having menstrual periods. A patient package insert for the product will be given with each prescription and refill. Read this sheet carefully each time. The sheet may change frequently. Overdosage: If you think you have taken too much of this medicine contact a poison control center or emergency room at once. NOTE: This medicine is only for you. Do not share this medicine with others. What if I miss a dose? Try not to miss a dose. Every time you miss a dose or take a dose late your chance of pregnancy increases. When 1 pill  is missed (even if only 3 hours late), take the missed pill as soon as possible and continue taking a pill each day at the regular time (use a back up method of birth control for the next 48 hours). If more than 1 dose is missed, use an additional birth control method for the rest of your pill pack until menses occurs. Contact your health care professional if more than 1 dose has been missed. What may interact with this medicine? Do not take this medicine with any of the following medications: -amprenavir or fosamprenavir -bosentan This medicine may also interact with the following medications: -antibiotics or medicines for infections, especially rifampin, rifabutin, rifapentine, and griseofulvin, and possibly penicillins or tetracyclines -aprepitant -barbiturate medicines, such as phenobarbital -carbamazepine -felbamate -modafinil -oxcarbazepine -phenytoin -ritonavir or other medicines for HIV infection or AIDS -St. John's wort -topiramate This list may not describe all possible interactions. Give your health care provider a list of all the medicines, herbs, non-prescription drugs, or dietary supplements you use. Also tell them if you smoke, drink alcohol, or use illegal drugs. Some items may interact with your medicine. What should I watch for while using this medicine? Visit your doctor or health care professional for regular checks on your progress. You will need a regular breast and pelvic exam and Pap smear while on this medicine. Use an additional method of birth control during the first cycle that you take these tablets. If you have any reason to think you are pregnant, stop taking this medicine right away and contact your doctor or health care professional. If you are taking this medicine for hormone related problems, it may take  several cycles of use to see improvement in your condition. This medicine does not protect you against HIV infection (AIDS) or any other sexually transmitted  diseases. What side effects may I notice from receiving this medicine? Side effects that you should report to your doctor or health care professional as soon as possible: -breast tenderness or discharge -pain in the abdomen, chest, groin or leg -severe headache -skin rash, itching, or hives -sudden shortness of breath -unusually weak or tired -vision or speech problems -yellowing of skin or eyes Side effects that usually do not require medical attention (report to your doctor or health care professional if they continue or are bothersome): -changes in sexual desire -change in menstrual flow -facial hair growth -fluid retention and swelling -headache -irritability -nausea -weight gain or loss This list may not describe all possible side effects. Call your doctor for medical advice about side effects. You may report side effects to FDA at 1-800-FDA-1088. Where should I keep my medicine? Keep out of the reach of children. Store at room temperature between 15 and 30 degrees C (59 and 86 degrees F). Throw away any unused medicine after the expiration date. NOTE: This sheet is a summary. It may not cover all possible information. If you have questions about this medicine, talk to your doctor, pharmacist, or health care provider.  2014, Elsevier/Gold Standard. (2011-10-28 16:41:35)

## 2013-03-18 NOTE — Progress Notes (Signed)
Patient ID: Stacy Booth, female   DOB: November 04, 1985, 28 y.o.   MRN: [id number] Subjective:    Stacy Booth is a 28 y.o. G80P2002 Caucasian female who presents for a postpartum visit. She is 6 weeks postpartum following a spontaneous vaginal delivery at 38.5 gestational weeks. Infant weighed 9lb4oz, no GDM. Anesthesia: epidural. I have fully reviewed the prenatal and intrapartum course. Postpartum course has been uncomplicated. Baby's course has been uncomplicated. Baby is feeding by breast. Bleeding no bleeding. Bowel function is normal. Bladder function is normal. Patient is not sexually active. Contraception method is none and desires micronor. Postpartum depression screening: positive. Score 12. Denies depression, states she just has some mild anxiety about normal things. Denies SI/HI/II. Able to eat/sleep, find joy in things. Last pap Jan 2014 and was neg.  The following portions of the patient's history were reviewed and updated as appropriate: allergies, current medications, past medical history, past surgical history and problem list.  Review of Systems Pertinent items are noted in HPI.   Filed Vitals:   03/18/13 1020  BP: 124/70  Height: 5\' 3"  (1.6 m)  Weight: 174 lb (78.926 kg)    Objective:     General:  alert, cooperative and no distress   Breasts:  deferred, no complaints  Lungs: clear to auscultation bilaterally  Heart:  regular rate and rhythm  Abdomen: soft, nontender   Vulva: normal  Vagina: normal vagina  Cervix:  closed  Corpus: Well-involuted  Adnexa:  Non-palpable  Rectal Exam: No hemorrhoids        Assessment:   Postpartum exam 6 wks s/p SVD Depression screening Mild anxiety Contraception counseling   Plan:   Contraception: desires micronor Follow up in: 1 year for pap & physical or as needed.  Rx micronor, understands strict schedule, to call when finished BF  Tawnya Crook CNM, [facility] 03/18/2013 10:46 AM

## 2013-12-23 ENCOUNTER — Encounter: Payer: Self-pay | Admitting: Women's Health

## 2015-08-12 ENCOUNTER — Ambulatory Visit (INDEPENDENT_AMBULATORY_CARE_PROVIDER_SITE_OTHER): Payer: BLUE CROSS/BLUE SHIELD | Admitting: Adult Health

## 2015-08-12 ENCOUNTER — Encounter: Payer: Self-pay | Admitting: Adult Health

## 2015-08-12 VITALS — BP 120/60 | HR 76 | Ht 63.0 in | Wt 170.5 lb

## 2015-08-12 DIAGNOSIS — O3680X0 Pregnancy with inconclusive fetal viability, not applicable or unspecified: Secondary | ICD-10-CM

## 2015-08-12 DIAGNOSIS — N926 Irregular menstruation, unspecified: Secondary | ICD-10-CM

## 2015-08-12 DIAGNOSIS — Z3201 Encounter for pregnancy test, result positive: Secondary | ICD-10-CM | POA: Diagnosis not present

## 2015-08-12 DIAGNOSIS — Z3490 Encounter for supervision of normal pregnancy, unspecified, unspecified trimester: Secondary | ICD-10-CM

## 2015-08-12 HISTORY — DX: Encounter for supervision of normal pregnancy, unspecified, unspecified trimester: Z34.90

## 2015-08-12 LAB — POCT URINE PREGNANCY: Preg Test, Ur: POSITIVE — AB

## 2015-08-12 NOTE — Patient Instructions (Signed)
First Trimester of Pregnancy The first trimester of pregnancy is from week 1 until the end of week 12 (months 1 through 3). A week after a sperm fertilizes an egg, the egg will implant on the wall of the uterus. This embryo will begin to develop into a baby. Genes from you and your partner are forming the baby. The female genes determine whether the baby is a boy or a girl. At 6-8 weeks, the eyes and face are formed, and the heartbeat can be seen on ultrasound. At the end of 12 weeks, all the baby's organs are formed.  Now that you are pregnant, you will want to do everything you can to have a healthy baby. Two of the most important things are to get good prenatal care and to follow your health care provider's instructions. Prenatal care is all the medical care you receive before the baby's birth. This care will help prevent, find, and treat any problems during the pregnancy and childbirth. BODY CHANGES Your body goes through many changes during pregnancy. The changes vary from woman to woman.   You may gain or lose a couple of pounds at first.  You may feel sick to your stomach (nauseous) and throw up (vomit). If the vomiting is uncontrollable, call your health care provider.  You may tire easily.  You may develop headaches that can be relieved by medicines approved by your health care provider.  You may urinate more often. Painful urination may mean you have a bladder infection.  You may develop heartburn as a result of your pregnancy.  You may develop constipation because certain hormones are causing the muscles that push waste through your intestines to slow down.  You may develop hemorrhoids or swollen, bulging veins (varicose veins).  Your breasts may begin to grow larger and become tender. Your nipples may stick out more, and the tissue that surrounds them (areola) may become darker.  Your gums may bleed and may be sensitive to brushing and flossing.  Dark spots or blotches (chloasma,  mask of pregnancy) may develop on your face. This will likely fade after the baby is born.  Your menstrual periods will stop.  You may have a loss of appetite.  You may develop cravings for certain kinds of food.  You may have changes in your emotions from day to day, such as being excited to be pregnant or being concerned that something may go wrong with the pregnancy and baby.  You may have more vivid and strange dreams.  You may have changes in your hair. These can include thickening of your hair, rapid growth, and changes in texture. Some women also have hair loss during or after pregnancy, or hair that feels dry or thin. Your hair will most likely return to normal after your baby is born. WHAT TO EXPECT AT YOUR PRENATAL VISITS During a routine prenatal visit:  You will be weighed to make sure you and the baby are growing normally.  Your blood pressure will be taken.  Your abdomen will be measured to track your baby's growth.  The fetal heartbeat will be listened to starting around week 10 or 12 of your pregnancy.  Test results from any previous visits will be discussed. Your health care provider may ask you:  How you are feeling.  If you are feeling the baby move.  If you have had any abnormal symptoms, such as leaking fluid, bleeding, severe headaches, or abdominal cramping.  If you are using any tobacco products,   including cigarettes, chewing tobacco, and electronic cigarettes.  If you have any questions. Other tests that may be performed during your first trimester include:  Blood tests to find your blood type and to check for the presence of any previous infections. They will also be used to check for low iron levels (anemia) and Rh antibodies. Later in the pregnancy, blood tests for diabetes will be done along with other tests if problems develop.  Urine tests to check for infections, diabetes, or protein in the urine.  An ultrasound to confirm the proper growth  and development of the baby.  An amniocentesis to check for possible genetic problems.  Fetal screens for spina bifida and Down syndrome.  You may need other tests to make sure you and the baby are doing well.  HIV (human immunodeficiency virus) testing. Routine prenatal testing includes screening for HIV, unless you choose not to have this test. HOME CARE INSTRUCTIONS  Medicines  Follow your health care provider's instructions regarding medicine use. Specific medicines may be either safe or unsafe to take during pregnancy.  Take your prenatal vitamins as directed.  If you develop constipation, try taking a stool softener if your health care provider approves. Diet  Eat regular, well-balanced meals. Choose a variety of foods, such as meat or vegetable-based protein, fish, milk and low-fat dairy products, vegetables, fruits, and whole grain breads and cereals. Your health care provider will help you determine the amount of weight gain that is right for you.  Avoid raw meat and uncooked cheese. These carry germs that can cause birth defects in the baby.  Eating four or five small meals rather than three large meals a day may help relieve nausea and vomiting. If you start to feel nauseous, eating a few soda crackers can be helpful. Drinking liquids between meals instead of during meals also seems to help nausea and vomiting.  If you develop constipation, eat more high-fiber foods, such as fresh vegetables or fruit and whole grains. Drink enough fluids to keep your urine clear or pale yellow. Activity and Exercise  Exercise only as directed by your health care provider. Exercising will help you:  Control your weight.  Stay in shape.  Be prepared for labor and delivery.  Experiencing pain or cramping in the lower abdomen or low back is a good sign that you should stop exercising. Check with your health care provider before continuing normal exercises.  Try to avoid standing for long  periods of time. Move your legs often if you must stand in one place for a long time.  Avoid heavy lifting.  Wear low-heeled shoes, and practice good posture.  You may continue to have sex unless your health care provider directs you otherwise. Relief of Pain or Discomfort  Wear a good support bra for breast tenderness.   Take warm sitz baths to soothe any pain or discomfort caused by hemorrhoids. Use hemorrhoid cream if your health care provider approves.   Rest with your legs elevated if you have leg cramps or low back pain.  If you develop varicose veins in your legs, wear support hose. Elevate your feet for 15 minutes, 3-4 times a day. Limit salt in your diet. Prenatal Care  Schedule your prenatal visits by the twelfth week of pregnancy. They are usually scheduled monthly at first, then more often in the last 2 months before delivery.  Write down your questions. Take them to your prenatal visits.  Keep all your prenatal visits as directed by your   health care provider. Safety  Wear your seat belt at all times when driving.  Make a list of emergency phone numbers, including numbers for family, friends, the hospital, and police and fire departments. General Tips  Ask your health care provider for a referral to a local prenatal education class. Begin classes no later than at the beginning of month 6 of your pregnancy.  Ask for help if you have counseling or nutritional needs during pregnancy. Your health care provider can offer advice or refer you to specialists for help with various needs.  Do not use hot tubs, steam rooms, or saunas.  Do not douche or use tampons or scented sanitary pads.  Do not cross your legs for long periods of time.  Avoid cat litter boxes and soil used by cats. These carry germs that can cause birth defects in the baby and possibly loss of the fetus by miscarriage or stillbirth.  Avoid all smoking, herbs, alcohol, and medicines not prescribed by  your health care provider. Chemicals in these affect the formation and growth of the baby.  Do not use any tobacco products, including cigarettes, chewing tobacco, and electronic cigarettes. If you need help quitting, ask your health care provider. You may receive counseling support and other resources to help you quit.  Schedule a dentist appointment. At home, brush your teeth with a soft toothbrush and be gentle when you floss. SEEK MEDICAL CARE IF:   You have dizziness.  You have mild pelvic cramps, pelvic pressure, or nagging pain in the abdominal area.  You have persistent nausea, vomiting, or diarrhea.  You have a bad smelling vaginal discharge.  You have pain with urination.  You notice increased swelling in your face, hands, legs, or ankles. SEEK IMMEDIATE MEDICAL CARE IF:   You have a fever.  You are leaking fluid from your vagina.  You have spotting or bleeding from your vagina.  You have severe abdominal cramping or pain.  You have rapid weight gain or loss.  You vomit blood or material that looks like coffee grounds.  You are exposed to [place] measles and have never had them.  You are exposed to fifth disease or chickenpox.  You develop a severe headache.  You have shortness of breath.  You have any kind of trauma, such as from a fall or a car accident.   This information is not intended to replace advice given to you by your health care provider. Make sure you discuss any questions you have with your health care provider.   Document Released: 02/01/2001 Document Revised: 02/28/2014 Document Reviewed: 12/18/2012 Elsevier Interactive Patient Education [employer]. Return in 2 days for dating [place] Eat often

## 2015-08-12 NOTE — Progress Notes (Signed)
Subjective:     Patient ID: Stacy Booth, female   DOB: 1986/01/26, 30 y.o.   MRN: [id number]  HPI [name] is a 30 year old white female in for a UPT, she has had 4+HPT and some nausea and mild cramps, no bleeding.   Review of Systems Patient denies any headaches, hearing loss, fatigue, blurred vision, shortness of breath, chest pain, problems with bowel movements, urination, or intercourse. No joint pain or mood swings.See HPI for positives. Reviewed past medical,surgical, social and family history. Reviewed medications and allergies.     Objective:   Physical Exam BP 120/60 mmHg  Pulse 76  Ht 5\' 3"  (1.6 m)  Wt 170 lb 8 oz (77.338 kg)  BMI 30.21 kg/m2  LMP 06/17/2015  Breastfeeding? No UPT + about 8 weeks by LMP with EDD 03/23/16, medicaid form given, Skin warm and dry. Neck: mid line trachea, normal thyroid, good ROM, no lymphadenopathy noted. Lungs: clear to ausculation bilaterally. Cardiovascular: regular rate and rhythm.Abdomen is soft and non tender.She is going on vacation next week, will get [place] this week.     Assessment:     UPT + Pregnant     Plan:     Return in 2 days for dating [place] Eat often Review handout on first trimester

## 2015-08-14 ENCOUNTER — Ambulatory Visit (INDEPENDENT_AMBULATORY_CARE_PROVIDER_SITE_OTHER): Payer: BLUE CROSS/BLUE SHIELD

## 2015-08-14 DIAGNOSIS — O3680X0 Pregnancy with inconclusive fetal viability, not applicable or unspecified: Secondary | ICD-10-CM | POA: Diagnosis not present

## 2015-08-14 DIAGNOSIS — Z3A09 9 weeks gestation of pregnancy: Secondary | ICD-10-CM | POA: Diagnosis not present

## 2015-08-14 NOTE — Progress Notes (Signed)
[place] 8+2 wks,single IUP w/ys,pos fht 178 bpm,normal ov's bilat,crl 21.9 mm

## 2015-08-27 ENCOUNTER — Telehealth: Payer: Self-pay | Admitting: Women's Health

## 2015-08-27 ENCOUNTER — Ambulatory Visit (INDEPENDENT_AMBULATORY_CARE_PROVIDER_SITE_OTHER): Payer: BLUE CROSS/BLUE SHIELD | Admitting: Adult Health

## 2015-08-27 ENCOUNTER — Encounter: Payer: Self-pay | Admitting: Adult Health

## 2015-08-27 VITALS — BP 120/80 | HR 82 | Ht 63.0 in | Wt 172.5 lb

## 2015-08-27 DIAGNOSIS — Z331 Pregnant state, incidental: Secondary | ICD-10-CM | POA: Diagnosis not present

## 2015-08-27 DIAGNOSIS — R300 Dysuria: Secondary | ICD-10-CM

## 2015-08-27 DIAGNOSIS — R319 Hematuria, unspecified: Secondary | ICD-10-CM | POA: Diagnosis not present

## 2015-08-27 DIAGNOSIS — R350 Frequency of micturition: Secondary | ICD-10-CM | POA: Diagnosis not present

## 2015-08-27 DIAGNOSIS — Z1389 Encounter for screening for other disorder: Secondary | ICD-10-CM

## 2015-08-27 DIAGNOSIS — N390 Urinary tract infection, site not specified: Secondary | ICD-10-CM

## 2015-08-27 DIAGNOSIS — Z3A11 11 weeks gestation of pregnancy: Secondary | ICD-10-CM

## 2015-08-27 HISTORY — DX: Hematuria, unspecified: R31.9

## 2015-08-27 HISTORY — DX: Dysuria: R30.0

## 2015-08-27 HISTORY — DX: Frequency of micturition: R35.0

## 2015-08-27 HISTORY — DX: Urinary tract infection, site not specified: N39.0

## 2015-08-27 LAB — POCT URINALYSIS DIPSTICK
GLUCOSE UA: NEGATIVE
Ketones, UA: NEGATIVE
Nitrite, UA: POSITIVE

## 2015-08-27 MED ORDER — CEPHALEXIN 500 MG PO CAPS: 500.0000 mg | ORAL_CAPSULE | Freq: Four times a day (QID) | ORAL | Status: DC

## 2015-08-27 NOTE — Patient Instructions (Signed)
Urinary Tract Infection Urinary tract infections (UTIs) can develop anywhere along your urinary tract. Your urinary tract is your body's drainage system for removing wastes and extra water. Your urinary tract includes two kidneys, two ureters, a bladder, and a urethra. Your kidneys are a pair of bean-shaped organs. Each kidney is about the size of your fist. They are located below your ribs, one on each side of your spine. CAUSES Infections are caused by microbes, which are microscopic organisms, including fungi, viruses, and bacteria. These organisms are so small that they can only be seen through a microscope. Bacteria are the microbes that most commonly cause UTIs. SYMPTOMS  Symptoms of UTIs may vary by age and gender of the patient and by the location of the infection. Symptoms in young women typically include a frequent and intense urge to urinate and a painful, burning feeling in the bladder or urethra during urination. Older women and men are more likely to be tired, shaky, and weak and have muscle aches and abdominal pain. A fever may mean the infection is in your kidneys. Other symptoms of a kidney infection include pain in your back or sides below the ribs, nausea, and vomiting. DIAGNOSIS To diagnose a UTI, your caregiver will ask you about your symptoms. Your caregiver will also ask you to provide a urine sample. The urine sample will be tested for bacteria and white blood cells. White blood cells are made by your body to help fight infection. TREATMENT  Typically, UTIs can be treated with medication. Because most UTIs are caused by a bacterial infection, they usually can be treated with the use of antibiotics. The choice of antibiotic and length of treatment depend on your symptoms and the type of bacteria causing your infection. HOME CARE INSTRUCTIONS  If you were prescribed antibiotics, take them exactly as your caregiver instructs you. Finish the medication even if you feel better after  you have only taken some of the medication.  Drink enough water and fluids to keep your urine clear or pale yellow.  Avoid caffeine, tea, and carbonated beverages. They tend to irritate your bladder.  Empty your bladder often. Avoid holding urine for long periods of time.  Empty your bladder before and after sexual intercourse.  After a bowel movement, women should cleanse from front to back. Use each tissue only once. SEEK MEDICAL CARE IF:   You have back pain.  You develop a fever.  Your symptoms do not begin to resolve within 3 days. SEEK IMMEDIATE MEDICAL CARE IF:   You have severe back pain or lower abdominal pain.  You develop chills.  You have nausea or vomiting.  You have continued burning or discomfort with urination. MAKE SURE YOU:   Understand these instructions.  Will watch your condition.  Will get help right away if you are not doing well or get worse.   This information is not intended to replace advice given to you by your health care provider. Make sure you discuss any questions you have with your health care provider.   Document Released: 11/17/2004 Document Revised: 10/29/2014 Document Reviewed: 03/18/2011 Elsevier Interactive Patient Education [employer]. Return as scheduled Increase water and juice  Take keflex as directed

## 2015-08-27 NOTE — Telephone Encounter (Signed)
Pt states that she has had some cramping and frequent urination and burning with urination. Pt states that she has had some light pink spotting when she wipes, only when she voids. Pt advised that she would need to be seen for evaluation. Phone call switched to front office and appointment was given.

## 2015-08-27 NOTE — Progress Notes (Signed)
Subjective:     Patient ID: Stacy Booth, female   DOB: 28-Feb-1985, 28 y.o.   MRN: [id number]  HPI Stacy Booth is a 30 year old white female, worked in for complaints of burning with urination and urinary frequency to day and wipes blood when voiding, no vaginal bleeding or blood in panties.She is about 10 + [redacted] week pregnant.She has some nausea but it is getting better.She has noticed palpitations about once a week, not associated with anything that she is aware of.No fever noted.  Review of Systems + Urinary frequency Burning with urination Wipes blood when pees Nausea is better + palpitations  No fever Reviewed past medical,surgical, social and family history. Reviewed medications and allergies.     Objective:   Physical Exam BP 120/80 mmHg  Pulse 82  Ht 5\' 3"  (1.6 m)  Wt 172 lb 8 oz (78.245 kg)  BMI 30.56 kg/m2  LMP 06/17/2015 (Exact Date) urine + nitrates, 2+ leuks, 3+ blood, trace protein it is dark yellow and cloudy, Skin warm and dry, Lungs: clear to ausculation bilaterally. Cardiovascular: regular rate and rhythm.Abdomen is soft and non tender,no CVAT, US show IUP with FHR 171.    Face time 15 minutes.  Assessment:     UTI Burning with urination Urinary frequency Hematuria Pregnant     Plan:     UA C&S sent Rx Keflex 500 mg take 1 tid x 7 days #26, no refills Push fluids and apple and cranberry juice Take note of what is going on when feels palpitation Return next week as scheduled for new OB visit Review handout on UTI and call if any problems or questions

## 2015-08-28 LAB — URINALYSIS, ROUTINE W REFLEX MICROSCOPIC
Bilirubin, UA: NEGATIVE
GLUCOSE, UA: NEGATIVE
Ketones, UA: NEGATIVE
NITRITE UA: POSITIVE — AB
Specific Gravity, UA: 1.018 (ref 1.005–1.030)
UUROB: 0.2 mg/dL (ref 0.2–1.0)
pH, UA: 6 (ref 5.0–7.5)

## 2015-08-28 LAB — MICROSCOPIC EXAMINATION: Casts: NONE SEEN /lpf

## 2015-08-29 LAB — URINE CULTURE

## 2015-09-01 ENCOUNTER — Ambulatory Visit (INDEPENDENT_AMBULATORY_CARE_PROVIDER_SITE_OTHER): Payer: Medicaid Other | Admitting: Women's Health

## 2015-09-01 ENCOUNTER — Other Ambulatory Visit (HOSPITAL_COMMUNITY)
Admission: RE | Admit: 2015-09-01 | Discharge: 2015-09-01 | Disposition: A | Discharge status: Home / Self Care | Payer: BLUE CROSS/BLUE SHIELD | Source: Ambulatory Visit | Attending: Obstetrics & Gynecology | Admitting: Obstetrics & Gynecology

## 2015-09-01 ENCOUNTER — Encounter: Payer: Self-pay | Admitting: Women's Health

## 2015-09-01 VITALS — BP 104/66 | HR 72 | Wt 167.0 lb

## 2015-09-01 DIAGNOSIS — O99341 Other mental disorders complicating pregnancy, first trimester: Secondary | ICD-10-CM

## 2015-09-01 DIAGNOSIS — Z3491 Encounter for supervision of normal pregnancy, unspecified, first trimester: Secondary | ICD-10-CM

## 2015-09-01 DIAGNOSIS — Z124 Encounter for screening for malignant neoplasm of cervix: Secondary | ICD-10-CM

## 2015-09-01 DIAGNOSIS — Z3481 Encounter for supervision of other normal pregnancy, first trimester: Secondary | ICD-10-CM

## 2015-09-01 DIAGNOSIS — Z01419 Encounter for gynecological examination (general) (routine) without abnormal findings: Secondary | ICD-10-CM | POA: Insufficient documentation

## 2015-09-01 DIAGNOSIS — Z113 Encounter for screening for infections with a predominantly sexual mode of transmission: Secondary | ICD-10-CM | POA: Insufficient documentation

## 2015-09-01 DIAGNOSIS — Z0283 Encounter for blood-alcohol and blood-drug test: Secondary | ICD-10-CM

## 2015-09-01 DIAGNOSIS — F418 Other specified anxiety disorders: Secondary | ICD-10-CM

## 2015-09-01 DIAGNOSIS — Z331 Pregnant state, incidental: Secondary | ICD-10-CM

## 2015-09-01 DIAGNOSIS — Z3682 Encounter for antenatal screening for nuchal translucency: Secondary | ICD-10-CM

## 2015-09-01 DIAGNOSIS — Z369 Encounter for antenatal screening, unspecified: Secondary | ICD-10-CM

## 2015-09-01 DIAGNOSIS — Z3483 Encounter for supervision of other normal pregnancy, third trimester: Secondary | ICD-10-CM | POA: Insufficient documentation

## 2015-09-01 DIAGNOSIS — Z3A11 11 weeks gestation of pregnancy: Secondary | ICD-10-CM

## 2015-09-01 DIAGNOSIS — O09299 Supervision of pregnancy with other poor reproductive or obstetric history, unspecified trimester: Secondary | ICD-10-CM | POA: Insufficient documentation

## 2015-09-01 DIAGNOSIS — Z1389 Encounter for screening for other disorder: Secondary | ICD-10-CM

## 2015-09-01 DIAGNOSIS — O09291 Supervision of pregnancy with other poor reproductive or obstetric history, first trimester: Secondary | ICD-10-CM

## 2015-09-01 LAB — POCT URINALYSIS DIPSTICK
Glucose, UA: NEGATIVE
KETONES UA: NEGATIVE
Leukocytes, UA: NEGATIVE
Nitrite, UA: NEGATIVE
PROTEIN UA: NEGATIVE
RBC UA: NEGATIVE

## 2015-09-01 NOTE — Patient Instructions (Signed)

## 2015-09-01 NOTE — Progress Notes (Signed)
  Subjective:  Stacy Booth is a 30 y.o. G73P2002 Caucasian female at [redacted]w[redacted]d by LMP c/w 8wk u/s, being seen today for her first obstetrical visit.  Her obstetrical history is significant for term uncomplicated svb x 2, last weighing 9lb4oz w/o GDM or shoulder dystocia.  Pregnancy history fully reviewed.  Patient reports no complaints. Denies vb, cramping, uti s/s, abnormal/malodorous vag d/c, or vulvovaginal itching/irritation.  BP 104/66 mmHg  Pulse 72  Wt 167 lb (75.751 kg)  LMP 06/17/2015 (Exact Date)  HISTORY: OB History  Gravida Para Term Preterm AB SAB TAB Ectopic Multiple Living  3 2 2       2     # Outcome Date GA Lbr Len/2nd Weight Sex Delivery Anes PTL Lv  3 Current           2 Term 02/02/13 [redacted]w[redacted]d 19:01 / 00:46 9 lb 4.2 oz (4.2 kg) M Vag-Spont EPI N Y  1 Term 04/01/11 [redacted]w[redacted]d  8 lb (3.629 kg) M Vag-Spont EPI N Y     Past Medical History  Diagnosis Date  . Medical history non-contributory   . Pregnant 08/12/2015  . UTI (lower urinary tract infection) 08/27/2015  . Hematuria 08/27/2015  . Burning with urination 08/27/2015  . Urinary frequency 08/27/2015   Past Surgical History  Procedure Laterality Date  . No past surgeries     Family History  Problem Relation Age of Onset  . Diabetes Mother   . Schizophrenia Brother   . Bipolar disorder Brother   . Mental retardation Brother   . Bipolar disorder Maternal Aunt   . Depression Maternal Aunt   . Schizophrenia Maternal Grandmother   . Diabetes Maternal Grandmother   . Mental retardation Maternal Grandmother   . Diabetes Maternal Grandfather   . Alcohol abuse Paternal Grandfather     Exam   System:     General: Well developed & nourished, no acute distress   Skin: Warm & dry, normal coloration and turgor, no rashes   Neurologic: Alert & oriented, normal mood   Cardiovascular: Regular rate & rhythm   Respiratory: Effort & rate normal, LCTAB, acyanotic   Abdomen: Soft, non tender   Extremities: normal strength, tone    Pelvic Exam:    Perineum: Normal perineum   Vulva: Normal, no lesions   Vagina:  Normal mucosa, normal discharge   Cervix: Normal, bulbous, appears closed   Uterus: Normal size/shape/contour for GA   Thin prep pap smear obtained w/ reflex high risk HPV cotesting FHR: 160s via informal u/s   Assessment:   Pregnancy: [id number] Patient Active Problem List   Diagnosis Date Noted  . Supervision of normal pregnancy 09/01/2015    Priority: High  . Depression with anxiety 09/01/2015  . UTI (lower urinary tract infection) 08/27/2015    [redacted]w[redacted]d G3P2002 New OB visit H/O LGA  Plan:  Initial labs drawn Continue prenatal vitamins Problem list reviewed and updated Reviewed n/v relief measures and warning s/s to report Reviewed recommended weight gain based on pre-gravid BMI Encouraged well-balanced diet Genetic Screening discussed Integrated Screen: requested Cystic fibrosis screening discussed declined Ultrasound discussed; fetal survey: requested Follow up in 1 weeks for 1st it/nt (no visit), then 4wks for 2nd IT and visit [place] completed  Tawnya Crook CNM, WHNP-BC 09/01/2015 12:00 PM

## 2015-09-02 LAB — CYTOLOGY - PAP

## 2015-09-03 LAB — CBC
HEMATOCRIT: 39.1 % (ref 34.0–46.6)
HEMOGLOBIN: 13.2 g/dL (ref 11.1–15.9)
MCH: 30.3 pg (ref 26.6–33.0)
MCHC: 33.8 g/dL (ref 31.5–35.7)
MCV: 90 fL (ref 79–97)
Platelets: 267 10*3/uL (ref 150–379)
RBC: 4.35 x10E6/uL (ref 3.77–5.28)
RDW: 14.2 % (ref 12.3–15.4)
WBC: 7 10*3/uL (ref 3.4–10.8)

## 2015-09-03 LAB — PMP SCREEN PROFILE (10S), URINE
AMPHETAMINE SCRN UR: NEGATIVE ng/mL
Barbiturate Screen, Ur: NEGATIVE ng/mL
Benzodiazepine Screen, Urine: NEGATIVE ng/mL
CANNABINOIDS UR QL SCN: NEGATIVE ng/mL
COCAINE(METAB.) SCREEN, URINE: NEGATIVE ng/mL
Creatinine(Crt), U: 186.1 mg/dL (ref 20.0–300.0)
Methadone Scn, Ur: NEGATIVE ng/mL
OPIATE SCRN UR: NEGATIVE ng/mL
Oxycodone+Oxymorphone Ur Ql Scn: NEGATIVE ng/mL
PCP Scrn, Ur: NEGATIVE ng/mL
Ph of Urine: 6.5 (ref 4.5–8.9)
Propoxyphene, Screen: NEGATIVE ng/mL

## 2015-09-03 LAB — URINALYSIS, ROUTINE W REFLEX MICROSCOPIC
Bilirubin, UA: NEGATIVE
GLUCOSE, UA: NEGATIVE
Ketones, UA: NEGATIVE
Leukocytes, UA: NEGATIVE
Nitrite, UA: NEGATIVE
PH UA: 7 (ref 5.0–7.5)
RBC, UA: NEGATIVE
Specific Gravity, UA: >=1.03 — AB (ref 1.005–1.030)
Urobilinogen, Ur: 0.2 mg/dL (ref 0.2–1.0)

## 2015-09-03 LAB — URINE CULTURE: Organism ID, Bacteria: NO GROWTH

## 2015-09-03 LAB — HEPATITIS B SURFACE ANTIGEN: HEP B S AG: NEGATIVE

## 2015-09-03 LAB — RPR: RPR: NONREACTIVE

## 2015-09-03 LAB — VARICELLA ZOSTER ANTIBODY, IGG: VARICELLA: 2450 {index} (ref >165)

## 2015-09-03 LAB — ANTIBODY SCREEN: ANTIBODY SCREEN: NEGATIVE

## 2015-09-03 LAB — RUBELLA SCREEN: Rubella Antibodies, IGG: 3.56 index (ref >0.99)

## 2015-09-03 LAB — HIV ANTIBODY (ROUTINE TESTING W REFLEX): HIV SCREEN 4TH GENERATION: NONREACTIVE

## 2015-09-10 ENCOUNTER — Other Ambulatory Visit: Payer: BLUE CROSS/BLUE SHIELD

## 2015-09-10 ENCOUNTER — Ambulatory Visit (INDEPENDENT_AMBULATORY_CARE_PROVIDER_SITE_OTHER): Payer: BLUE CROSS/BLUE SHIELD

## 2015-09-10 DIAGNOSIS — Z36 Encounter for antenatal screening of mother: Secondary | ICD-10-CM

## 2015-09-10 DIAGNOSIS — Z3491 Encounter for supervision of normal pregnancy, unspecified, first trimester: Secondary | ICD-10-CM

## 2015-09-10 DIAGNOSIS — Z3A12 12 weeks gestation of pregnancy: Secondary | ICD-10-CM

## 2015-09-10 DIAGNOSIS — Z369 Encounter for antenatal screening, unspecified: Secondary | ICD-10-CM

## 2015-09-10 DIAGNOSIS — Z3682 Encounter for antenatal screening for nuchal translucency: Secondary | ICD-10-CM

## 2015-09-10 NOTE — Progress Notes (Signed)
[place] 12+1 wks,measurements c/w dates,normal ov's bilat,NT 2.1 mm,NB present,crl 66.4 mm,fhr 153 bpm

## 2015-09-12 LAB — MATERNAL SCREEN, INTEGRATED #1
Crown Rump Length: 66.4 mm
Gest. Age on Collection Date: 12.9 weeks
MATERNAL AGE AT EDD: 28.7 a
NUCHAL TRANSLUCENCY (NT): 2.1 mm
NUMBER OF FETUSES: 1
PAPP-A Value: 467.1 ng/mL
Weight: 168 [lb_av]

## 2015-09-29 ENCOUNTER — Ambulatory Visit (INDEPENDENT_AMBULATORY_CARE_PROVIDER_SITE_OTHER): Payer: BLUE CROSS/BLUE SHIELD | Admitting: Obstetrics & Gynecology

## 2015-09-29 ENCOUNTER — Encounter: Payer: Self-pay | Admitting: Obstetrics & Gynecology

## 2015-09-29 VITALS — BP 124/64 | HR 80 | Wt 172.0 lb

## 2015-09-29 DIAGNOSIS — Z1389 Encounter for screening for other disorder: Secondary | ICD-10-CM

## 2015-09-29 DIAGNOSIS — Z331 Pregnant state, incidental: Secondary | ICD-10-CM

## 2015-09-29 DIAGNOSIS — Z3492 Encounter for supervision of normal pregnancy, unspecified, second trimester: Secondary | ICD-10-CM

## 2015-09-29 DIAGNOSIS — Z3682 Encounter for antenatal screening for nuchal translucency: Secondary | ICD-10-CM

## 2015-09-29 LAB — POCT URINALYSIS DIPSTICK
Blood, UA: NEGATIVE
Glucose, UA: NEGATIVE
Ketones, UA: NEGATIVE
LEUKOCYTES UA: NEGATIVE
Nitrite, UA: NEGATIVE
Protein, UA: NEGATIVE

## 2015-09-29 NOTE — Progress Notes (Signed)
[id number] [redacted]w[redacted]d Estimated Date of Delivery: 03/23/16  Blood pressure 124/64, pulse 80, weight 172 lb (78 kg), last menstrual period 06/17/2015.   BP weight and urine results all reviewed and noted.  Please refer to the obstetrical flow sheet for the fundal height and fetal heart rate documentation:  Patient reports good fetal movement, denies any bleeding and no rupture of membranes symptoms or regular contractions. Patient is without complaints. All questions were answered.  Orders Placed This Encounter  Procedures  . US OB Comp + 14 Wk  . Maternal Screen, Integrated #2  . POCT urinalysis dipstick    Plan:  Continued routine obstetrical care, sonogram next visit 2nd IT today  Return in about 4 weeks (around 10/27/2015) for 20 week sono, LROB.

## 2015-10-01 LAB — MATERNAL SCREEN, INTEGRATED #2
AFP MARKER: 36.6 ng/mL
AFP MoM: 1.36
Crown Rump Length: 66.4 mm
DIA MoM: 1.55
DIA Value: 263.5 pg/mL
Estriol, Unconjugated: 0.47 ng/mL
GEST. AGE ON COLLECTION DATE: 12.9 wk
GESTATIONAL AGE: 15.6 wk
Maternal Age at EDD: 28.7 years
Nuchal Translucency (NT): 2.1 mm
Nuchal Translucency MoM: 1.27
Number of Fetuses: 1
PAPP-A MOM: 0.49
PAPP-A VALUE: 467.1 ng/mL
PDF: 0
TEST RESULTS: NEGATIVE
WEIGHT: 168 [lb_av]
Weight: 168 [lb_av]
hCG MoM: 2.43
hCG Value: 91 IU/mL
uE3 MoM: 0.65

## 2015-10-23 ENCOUNTER — Other Ambulatory Visit: Payer: Self-pay | Admitting: Obstetrics & Gynecology

## 2015-10-23 DIAGNOSIS — Z1389 Encounter for screening for other disorder: Secondary | ICD-10-CM

## 2015-10-27 ENCOUNTER — Ambulatory Visit (INDEPENDENT_AMBULATORY_CARE_PROVIDER_SITE_OTHER): Payer: BLUE CROSS/BLUE SHIELD

## 2015-10-27 ENCOUNTER — Encounter: Payer: Self-pay | Admitting: Obstetrics & Gynecology

## 2015-10-27 ENCOUNTER — Ambulatory Visit (INDEPENDENT_AMBULATORY_CARE_PROVIDER_SITE_OTHER): Payer: BLUE CROSS/BLUE SHIELD | Admitting: Obstetrics & Gynecology

## 2015-10-27 VITALS — BP 110/60 | HR 72 | Wt 174.0 lb

## 2015-10-27 DIAGNOSIS — Z3A19 19 weeks gestation of pregnancy: Secondary | ICD-10-CM

## 2015-10-27 DIAGNOSIS — Z3492 Encounter for supervision of normal pregnancy, unspecified, second trimester: Secondary | ICD-10-CM

## 2015-10-27 DIAGNOSIS — Z331 Pregnant state, incidental: Secondary | ICD-10-CM

## 2015-10-27 DIAGNOSIS — Z36 Encounter for antenatal screening of mother: Secondary | ICD-10-CM | POA: Diagnosis not present

## 2015-10-27 DIAGNOSIS — Z1389 Encounter for screening for other disorder: Secondary | ICD-10-CM

## 2015-10-27 DIAGNOSIS — Z3482 Encounter for supervision of other normal pregnancy, second trimester: Secondary | ICD-10-CM

## 2015-10-27 DIAGNOSIS — O321XX1 Maternal care for breech presentation, fetus 1: Secondary | ICD-10-CM | POA: Diagnosis not present

## 2015-10-27 LAB — POCT URINALYSIS DIPSTICK
Blood, UA: NEGATIVE
Glucose, UA: NEGATIVE
KETONES UA: NEGATIVE
LEUKOCYTES UA: NEGATIVE
Nitrite, UA: NEGATIVE

## 2015-10-27 NOTE — Progress Notes (Signed)
[place] 18+6 wks,breech,post pl gr 0,cx 4.5 cm,svp of fluid 5.2 cm,normal ov's bilat,fhr 141 bpm,efw 295 g,anatomy complete, no obvious abnormalities seen

## 2015-10-27 NOTE — Progress Notes (Signed)
[id number] [redacted]w[redacted]d Estimated Date of Delivery: 03/23/16  Blood pressure 110/60, pulse 72, weight 174 lb (78.9 kg), last menstrual period 06/17/2015.   BP weight and urine results all reviewed and noted.  Please refer to the obstetrical flow sheet for the fundal height and fetal heart rate documentation:  Patient reports good fetal movement, denies any bleeding and no rupture of membranes symptoms or regular contractions. Patient is without complaints. All questions were answered.  Orders Placed This Encounter  Procedures  . POCT urinalysis dipstick    Plan:  Continued routine obstetrical care, sonogram is reviewed and is normal  Return in about 4 weeks (around 11/24/2015) for LROB.

## 2015-11-26 ENCOUNTER — Encounter: Payer: Self-pay | Admitting: Advanced Practice Midwife

## 2015-11-26 ENCOUNTER — Ambulatory Visit (INDEPENDENT_AMBULATORY_CARE_PROVIDER_SITE_OTHER): Payer: BLUE CROSS/BLUE SHIELD | Admitting: Advanced Practice Midwife

## 2015-11-26 VITALS — BP 120/60 | HR 82 | Wt 181.0 lb

## 2015-11-26 DIAGNOSIS — Z3483 Encounter for supervision of other normal pregnancy, third trimester: Secondary | ICD-10-CM

## 2015-11-26 DIAGNOSIS — Z1389 Encounter for screening for other disorder: Secondary | ICD-10-CM

## 2015-11-26 DIAGNOSIS — Z331 Pregnant state, incidental: Secondary | ICD-10-CM

## 2015-11-26 LAB — POCT URINALYSIS DIPSTICK
Blood, UA: NEGATIVE
Glucose, UA: NEGATIVE
Ketones, UA: NEGATIVE
Leukocytes, UA: NEGATIVE
Nitrite, UA: NEGATIVE
Protein, UA: NEGATIVE

## 2015-11-26 NOTE — Progress Notes (Signed)
[id number] [redacted]w[redacted]d Estimated Date of Delivery: 03/23/16  Blood pressure 120/60, pulse 82, weight 181 lb (82.1 kg), last menstrual period 06/17/2015.   BP weight and urine results all reviewed and noted.  Please refer to the obstetrical flow sheet for the fundal height and fetal heart rate documentation:  Patient reports good fetal movement, denies any bleeding and no rupture of membranes symptoms or regular contractions. Patient is without complaints. All questions were answered.  Orders Placed This Encounter  Procedures  . POCT urinalysis dipstick    Plan:  Continued routine obstetrical care,   Return in about 4 weeks (around 12/24/2015) for PN2/LROB.

## 2015-11-26 NOTE — Patient Instructions (Signed)

## 2015-12-24 ENCOUNTER — Ambulatory Visit (INDEPENDENT_AMBULATORY_CARE_PROVIDER_SITE_OTHER): Payer: BLUE CROSS/BLUE SHIELD | Admitting: Women's Health

## 2015-12-24 ENCOUNTER — Encounter: Payer: Self-pay | Admitting: Women's Health

## 2015-12-24 ENCOUNTER — Other Ambulatory Visit: Payer: BLUE CROSS/BLUE SHIELD

## 2015-12-24 VITALS — BP 110/60 | HR 82 | Wt 185.0 lb

## 2015-12-24 DIAGNOSIS — Z3A27 27 weeks gestation of pregnancy: Secondary | ICD-10-CM

## 2015-12-24 DIAGNOSIS — Z131 Encounter for screening for diabetes mellitus: Secondary | ICD-10-CM

## 2015-12-24 DIAGNOSIS — Z331 Pregnant state, incidental: Secondary | ICD-10-CM

## 2015-12-24 DIAGNOSIS — Z1389 Encounter for screening for other disorder: Secondary | ICD-10-CM

## 2015-12-24 DIAGNOSIS — Z3483 Encounter for supervision of other normal pregnancy, third trimester: Secondary | ICD-10-CM

## 2015-12-24 DIAGNOSIS — K429 Umbilical hernia without obstruction or gangrene: Secondary | ICD-10-CM | POA: Insufficient documentation

## 2015-12-24 LAB — POCT URINALYSIS DIPSTICK
Glucose, UA: NEGATIVE
KETONES UA: NEGATIVE
Leukocytes, UA: NEGATIVE
Nitrite, UA: NEGATIVE
Protein, UA: NEGATIVE
RBC UA: NEGATIVE

## 2015-12-24 NOTE — Patient Instructions (Addendum)
Call the office [phone]) or go to [facility] if:  You begin to have strong, frequent contractions  Your water breaks.  Sometimes it is a big gush of fluid, sometimes it is just a trickle that keeps getting your panties wet or running down your legs  You have vaginal bleeding.  It is normal to have a small amount of spotting if your cervix was checked.   You don't feel your baby moving like normal.  If you don't, get you something to eat and drink and lay down and focus on feeling your baby move.  You should feel at least 10 movements in 2 hours.  If you don't, you should call the office or go to [facility]    Tdap Vaccine  It is recommended that you get the Tdap vaccine during the third trimester of EACH pregnancy to help protect your baby from getting pertussis (whooping cough)  27-36 weeks is the BEST time to do this so that you can pass the protection on to your baby. During pregnancy is better than after pregnancy, but if you are unable to get it during pregnancy it will be offered at the hospital.   You can get this vaccine at the health department or your family doctor  Everyone who will be around your baby should also be up-to-date on their vaccines. Adults (who are not pregnant) only need 1 dose of Tdap during adulthood.   Nausea & Vomiting  Have saltine crackers or pretzels by your bed and eat a few bites before you raise your head out of bed in the morning  Eat small frequent meals throughout the day instead of large meals  Drink plenty of fluids throughout the day to stay hydrated, just don't drink a lot of fluids with your meals.  This can make your stomach fill up faster making you feel sick  Do not brush your teeth right after you eat  Products with real ginger are good for nausea, like ginger ale and ginger hard candy Make sure it says made with real ginger!  Sucking on sour candy like lemon heads is also good for nausea  If your prenatal vitamins make  you nauseated, take them at night so you will sleep through the nausea  Sea Bands  If you feel like you need medicine for the nausea & vomiting please let us know  If you are unable to keep any fluids or food down please let us know    Third Trimester of Pregnancy The third trimester is from week 29 through week 42, months 7 through 9. The third trimester is a time when the fetus is growing rapidly. At the end of the ninth month, the fetus is about 20 inches in length and weighs 6-10 pounds.  BODY CHANGES Your body goes through many changes during pregnancy. The changes vary from woman to woman.   Your weight will continue to increase. You can expect to gain 25-35 pounds (11-16 kg) by the end of the pregnancy.  You may begin to get stretch marks on your hips, abdomen, and breasts.  You may urinate more often because the fetus is moving lower into your pelvis and pressing on your bladder.  You may develop or continue to have heartburn as a result of your pregnancy.  You may develop constipation because certain hormones are causing the muscles that push waste through your intestines to slow down.  You may develop hemorrhoids or swollen, bulging veins (varicose veins).  You  may have pelvic pain because of the weight gain and pregnancy hormones relaxing your joints between the bones in your pelvis. Backaches may result from overexertion of the muscles supporting your posture.  You may have changes in your hair. These can include thickening of your hair, rapid growth, and changes in texture. Some women also have hair loss during or after pregnancy, or hair that feels dry or thin. Your hair will most likely return to normal after your baby is born.  Your breasts will continue to grow and be tender. A yellow discharge may leak from your breasts called colostrum.  Your belly button may stick out.  You may feel short of breath because of your expanding uterus.  You may notice the fetus  "dropping," or moving lower in your abdomen.  You may have a bloody mucus discharge. This usually occurs a few days to a week before labor begins.  Your cervix becomes thin and soft (effaced) near your due date. WHAT TO EXPECT AT YOUR PRENATAL EXAMS  You will have prenatal exams every 2 weeks until week 36. Then, you will have weekly prenatal exams. During a routine prenatal visit:  You will be weighed to make sure you and the fetus are growing normally.  Your blood pressure is taken.  Your abdomen will be measured to track your baby's growth.  The fetal heartbeat will be listened to.  Any test results from the previous visit will be discussed.  You may have a cervical check near your due date to see if you have effaced. At around 36 weeks, your caregiver will check your cervix. At the same time, your caregiver will also perform a test on the secretions of the vaginal tissue. This test is to determine if a type of bacteria, Group B streptococcus, is present. Your caregiver will explain this further. Your caregiver may ask you:  What your birth plan is.  How you are feeling.  If you are feeling the baby move.  If you have had any abnormal symptoms, such as leaking fluid, bleeding, severe headaches, or abdominal cramping.  If you have any questions. Other tests or screenings that may be performed during your third trimester include:  Blood tests that check for low iron levels (anemia).  Fetal testing to check the health, activity level, and growth of the fetus. Testing is done if you have certain medical conditions or if there are problems during the pregnancy. FALSE LABOR You may feel small, irregular contractions that eventually go away. These are called Braxton Hicks contractions, or false labor. Contractions may last for hours, days, or even weeks before true labor sets in. If contractions come at regular intervals, intensify, or become painful, it is best to be seen by your  caregiver.  SIGNS OF LABOR   Menstrual-like cramps.  Contractions that are 5 minutes apart or less.  Contractions that start on the top of the uterus and spread down to the lower abdomen and back.  A sense of increased pelvic pressure or back pain.  A watery or bloody mucus discharge that comes from the vagina. If you have any of these signs before the 37th week of pregnancy, call your caregiver right away. You need to go to the hospital to get checked immediately. HOME CARE INSTRUCTIONS   Avoid all smoking, herbs, alcohol, and unprescribed drugs. These chemicals affect the formation and growth of the baby.  Follow your caregiver's instructions regarding medicine use. There are medicines that are either safe or unsafe  to take during pregnancy.  Exercise only as directed by your caregiver. Experiencing uterine cramps is a good sign to stop exercising.  Continue to eat regular, healthy meals.  Wear a good support bra for breast tenderness.  Do not use hot tubs, steam rooms, or saunas.  Wear your seat belt at all times when driving.  Avoid raw meat, uncooked cheese, cat litter boxes, and soil used by cats. These carry germs that can cause birth defects in the baby.  Take your prenatal vitamins.  Try taking a stool softener (if your caregiver approves) if you develop constipation. Eat more high-fiber foods, such as fresh vegetables or fruit and whole grains. Drink plenty of fluids to keep your urine clear or pale yellow.  Take warm sitz baths to soothe any pain or discomfort caused by hemorrhoids. Use hemorrhoid cream if your caregiver approves.  If you develop varicose veins, wear support hose. Elevate your feet for 15 minutes, 3-4 times a day. Limit salt in your diet.  Avoid heavy lifting, wear low heal shoes, and practice good posture.  Rest a lot with your legs elevated if you have leg cramps or low back pain.  Visit your dentist if you have not gone during your pregnancy.  Use a soft toothbrush to brush your teeth and be gentle when you floss.  A sexual relationship may be continued unless your caregiver directs you otherwise.  Do not travel far distances unless it is absolutely necessary and only with the approval of your caregiver.  Take prenatal classes to understand, practice, and ask questions about the labor and delivery.  Make a trial run to the hospital.  Pack your hospital bag.  Prepare the baby's nursery.  Continue to go to all your prenatal visits as directed by your caregiver. SEEK MEDICAL CARE IF:  You are unsure if you are in labor or if your water has broken.  You have dizziness.  You have mild pelvic cramps, pelvic pressure, or nagging pain in your abdominal area.  You have persistent nausea, vomiting, or diarrhea.  You have a bad smelling vaginal discharge.  You have pain with urination. SEEK IMMEDIATE MEDICAL CARE IF:   You have a fever.  You are leaking fluid from your vagina.  You have spotting or bleeding from your vagina.  You have severe abdominal cramping or pain.  You have rapid weight loss or gain.  You have shortness of breath with chest pain.  You notice sudden or extreme swelling of your face, hands, ankles, feet, or legs.  You have not felt your baby move in over an hour.  You have severe headaches that do not go away with medicine.  You have vision changes. Document Released: 02/01/2001 Document Revised: 02/12/2013 Document Reviewed: 04/10/2012 [facility] Patient Information 2015 [place], [place]. This information is not intended to replace advice given to you by your health care provider. Make sure you discuss any questions you have with your health care provider.

## 2015-12-24 NOTE — Progress Notes (Addendum)
Low-risk OB appointment [id number] [redacted]w[redacted]d Estimated Date of Delivery: 03/23/16 BP 110/60   Pulse 82   Wt 185 lb (83.9 kg)   LMP 06/17/2015 (Exact Date)   BMI 32.77 kg/m   BP, weight, and urine reviewed.  Refer to obstetrical flow sheet for FH & FHR.  Reports good fm.  Denies regular uc's, lof, vb, or uti s/s. Nausea- declines meds, no vomiting.  Reducible umbilical hernia, discussed care and incarceration s/s.  Reviewed ptl s/s, fkc. Recommended Tdap at HD/PCP per CDC guidelines.  Plan:  Continue routine obstetrical care  F/U in 3wks for OB appointment  PN2 today

## 2015-12-25 LAB — ANTIBODY SCREEN: ANTIBODY SCREEN: NEGATIVE

## 2015-12-25 LAB — CBC
HEMATOCRIT: 33.9 % — AB (ref 34.0–46.6)
HEMOGLOBIN: 11.3 g/dL (ref 11.1–15.9)
MCH: 30.1 pg (ref 26.6–33.0)
MCHC: 33.3 g/dL (ref 31.5–35.7)
MCV: 90 fL (ref 79–97)
Platelets: 224 10*3/uL (ref 150–379)
RBC: 3.76 x10E6/uL — ABNORMAL LOW (ref 3.77–5.28)
RDW: 13.3 % (ref 12.3–15.4)
WBC: 8.5 10*3/uL (ref 3.4–10.8)

## 2015-12-25 LAB — HIV ANTIBODY (ROUTINE TESTING W REFLEX): HIV Screen 4th Generation wRfx: NONREACTIVE

## 2015-12-25 LAB — RPR: RPR Ser Ql: NONREACTIVE

## 2015-12-25 LAB — GLUCOSE TOLERANCE, 2 HOURS W/ 1HR
GLUCOSE, 2 HOUR: 126 mg/dL (ref 65–152)
GLUCOSE, FASTING: 88 mg/dL (ref 65–91)
Glucose, 1 hour: 152 mg/dL (ref 65–179)

## 2016-01-13 ENCOUNTER — Encounter: Payer: Self-pay | Admitting: Obstetrics & Gynecology

## 2016-01-13 ENCOUNTER — Ambulatory Visit (INDEPENDENT_AMBULATORY_CARE_PROVIDER_SITE_OTHER): Payer: BLUE CROSS/BLUE SHIELD | Admitting: Obstetrics & Gynecology

## 2016-01-13 ENCOUNTER — Encounter: Payer: BLUE CROSS/BLUE SHIELD | Admitting: Advanced Practice Midwife

## 2016-01-13 VITALS — BP 120/80 | HR 88 | Temp 98.2°F | Wt 191.4 lb

## 2016-01-13 DIAGNOSIS — Z331 Pregnant state, incidental: Secondary | ICD-10-CM

## 2016-01-13 DIAGNOSIS — Z3483 Encounter for supervision of other normal pregnancy, third trimester: Secondary | ICD-10-CM

## 2016-01-13 DIAGNOSIS — Z3A30 30 weeks gestation of pregnancy: Secondary | ICD-10-CM

## 2016-01-13 DIAGNOSIS — Z1389 Encounter for screening for other disorder: Secondary | ICD-10-CM

## 2016-01-13 LAB — POCT URINALYSIS DIPSTICK
Glucose, UA: NEGATIVE
KETONES UA: NEGATIVE
Leukocytes, UA: NEGATIVE
Nitrite, UA: NEGATIVE
Protein, UA: NEGATIVE
RBC UA: NEGATIVE

## 2016-01-13 NOTE — Addendum Note (Signed)
Addended by: Diona Fanti A on: 01/13/2016 04:36 PM   Modules accepted: Orders

## 2016-01-13 NOTE — Progress Notes (Signed)
[id number] [redacted]w[redacted]d Estimated Date of Delivery: 03/23/16  Blood pressure 120/80, pulse 88, temperature 98.2 F (36.8 C), weight 191 lb 6.4 oz (86.8 kg), last menstrual period 06/17/2015.   BP weight and urine results all reviewed and noted.  Please refer to the obstetrical flow sheet for the fundal height and fetal heart rate documentation:  Patient reports good fetal movement, denies any bleeding and no rupture of membranes symptoms or regular contractions. Patient is without complaints. All questions were answered.  No orders of the defined types were placed in this encounter.   Plan:  Continued routine obstetrical care, sonogram 36-37 weeks for EFW  Return in about 2 weeks (around 01/27/2016) for [place].

## 2016-01-28 ENCOUNTER — Ambulatory Visit (INDEPENDENT_AMBULATORY_CARE_PROVIDER_SITE_OTHER): Payer: BLUE CROSS/BLUE SHIELD | Admitting: Women's Health

## 2016-01-28 ENCOUNTER — Encounter: Payer: Self-pay | Admitting: Women's Health

## 2016-01-28 VITALS — BP 128/60 | HR 88 | Wt 193.5 lb

## 2016-01-28 DIAGNOSIS — Z3483 Encounter for supervision of other normal pregnancy, third trimester: Secondary | ICD-10-CM

## 2016-01-28 DIAGNOSIS — Z1389 Encounter for screening for other disorder: Secondary | ICD-10-CM

## 2016-01-28 DIAGNOSIS — Z331 Pregnant state, incidental: Secondary | ICD-10-CM

## 2016-01-28 DIAGNOSIS — R82998 Other abnormal findings in urine: Secondary | ICD-10-CM

## 2016-01-28 DIAGNOSIS — Z3A33 33 weeks gestation of pregnancy: Secondary | ICD-10-CM

## 2016-01-28 DIAGNOSIS — R8299 Other abnormal findings in urine: Secondary | ICD-10-CM

## 2016-01-28 DIAGNOSIS — N6320 Unspecified lump in the left breast, unspecified quadrant: Secondary | ICD-10-CM

## 2016-01-28 DIAGNOSIS — N63 Unspecified lump in unspecified breast: Secondary | ICD-10-CM

## 2016-01-28 LAB — POCT URINALYSIS DIPSTICK
Glucose, UA: NEGATIVE
KETONES UA: NEGATIVE
Nitrite, UA: NEGATIVE
PROTEIN UA: NEGATIVE
RBC UA: NEGATIVE

## 2016-01-28 NOTE — Progress Notes (Signed)
Low-risk OB appointment [id number] [redacted]w[redacted]d Estimated Date of Delivery: 03/23/16 BP 128/60   Pulse 88   Wt 193 lb 8 oz (87.8 kg)   LMP 06/17/2015 (Exact Date)   BMI 34.28 kg/m   BP, weight, and urine reviewed.  Refer to obstetrical flow sheet for FH & FHR.  Reports good fm.  Denies regular uc's, lof, vb, or uti s/s. Cyst on chest wall x 34yrs- had it checked out at [facility] ED originally- they said just cyst and to f/u but she forgot to, hurts occasionally. ~4cm soft round tissue ~11o'clock near sternum but in breast tissue. Co-exam w/ [facility]- feels like it is ductal, recommends breast u/s. Scheduled for 12/12 @ [facility] @ 4pm. No lotion/powder/deoderant/perfume that am.  Some LBP- discussed and gave printed relief measures.  Reviewed ptl s/s, fkc. Recommended Tdap at HD/PCP per CDC guidelines.  Plan:  Continue routine obstetrical care  F/U in 2wks for OB appointment

## 2016-01-28 NOTE — Patient Instructions (Addendum)
Breast ultrasound at [facility] 12/12 at 4pm. No lotion/powder/deoderant/perfume that day  Call the office [phone]) or go to [facility] if:  You begin to have strong, frequent contractions  Your water breaks.  Sometimes it is a big gush of fluid, sometimes it is just a trickle that keeps getting your panties wet or running down your legs  You have vaginal bleeding.  It is normal to have a small amount of spotting if your cervix was checked.   You don't feel your baby moving like normal.  If you don't, get you something to eat and drink and lay down and focus on feeling your baby move.  You should feel at least 10 movements in 2 hours.  If you don't, you should call the office or go to [facility] your lower back pain you may:  Purchase a pregnancy belt from [employer] [place], [employer], [employer], etc and wear it while you are up and about  Take warm baths  Use a heating pad to your lower back for no longer than 20 minutes at a time, and do not place near abdomen  Take tylenol as needed. Please follow directions on the bottle   Tdap Vaccine  It is recommended that you get the Tdap vaccine during the third trimester of EACH pregnancy to help protect your baby from getting pertussis (whooping cough)  27-36 weeks is the BEST time to do this so that you can pass the protection on to your baby. During pregnancy is better than after pregnancy, but if you are unable to get it during pregnancy it will be offered at the hospital.   You can get this vaccine at the health department or your family doctor  Everyone who will be around your baby should also be up-to-date on their vaccines. Adults (who are not pregnant) only need 1 dose of Tdap during adulthood.     Preterm Labor and Birth Information The normal length of a pregnancy is 39-41 weeks. Preterm labor is when labor starts before 37 completed weeks of pregnancy. What are the risk factors for preterm  labor? Preterm labor is more likely to occur in women who:  Have certain infections during pregnancy such as a bladder infection, sexually transmitted infection, or infection inside the uterus (chorioamnionitis).  Have a shorter-than-normal cervix.  Have gone into preterm labor before.  Have had surgery on their cervix.  Are younger than age 9 or older than age 54.  Are African American.  Are pregnant with twins or multiple babies (multiple gestation).  Take street drugs or smoke while pregnant.  Do not gain enough weight while pregnant.  Became pregnant shortly after having been pregnant. What are the symptoms of preterm labor? Symptoms of preterm labor include:  Cramps similar to those that can happen during a menstrual period. The cramps may happen with diarrhea.  Pain in the abdomen or lower back.  Regular uterine contractions that may feel like tightening of the abdomen.  A feeling of increased pressure in the pelvis.  Increased watery or bloody mucus discharge from the vagina.  Water breaking (ruptured amniotic sac). Why is it important to recognize signs of preterm labor? It is important to recognize signs of preterm labor because babies who are born prematurely may not be fully developed. This can put them at an increased risk for:  Long-term (chronic) heart and lung problems.  Difficulty immediately after birth with regulating body systems, including blood sugar, body temperature,  heart rate, and breathing rate.  Bleeding in the brain.  Cerebral palsy.  Learning difficulties.  Death. These risks are highest for babies who are born before 15 weeks of pregnancy. How is preterm labor treated? Treatment depends on the length of your pregnancy, your condition, and the health of your baby. It may involve:  Having a stitch (suture) placed in your cervix to prevent your cervix from opening too early (cerclage).  Taking or being given medicines, such  as:  Hormone medicines. These may be given early in pregnancy to help support the pregnancy.  Medicine to stop contractions.  Medicines to help mature the baby's lungs. These may be prescribed if the risk of delivery is high.  Medicines to prevent your baby from developing cerebral palsy. If the labor happens before 34 weeks of pregnancy, you may need to stay in the hospital. What should I do if I think I am in preterm labor? If you think that you are going into preterm labor, call your health care provider right away. How can I prevent preterm labor in future pregnancies? To increase your chance of having a full-term pregnancy:  Do not use any tobacco products, such as cigarettes, chewing tobacco, and e-cigarettes. If you need help quitting, ask your health care provider.  Do not use street drugs or medicines that have not been prescribed to you during your pregnancy.  Talk with your health care provider before taking any herbal supplements, even if you have been taking them regularly.  Make sure you gain a healthy amount of weight during your pregnancy.  Watch for infection. If you think that you might have an infection, get it checked right away.  Make sure to tell your health care provider if you have gone into preterm labor before. This information is not intended to replace advice given to you by your health care provider. Make sure you discuss any questions you have with your health care provider. Document Released: 04/30/2003 Document Revised: 07/21/2015 Document Reviewed: 07/01/2015 Elsevier Interactive Patient Education  2017 [employer].

## 2016-01-31 LAB — URINE CULTURE

## 2016-02-02 ENCOUNTER — Other Ambulatory Visit (HOSPITAL_COMMUNITY): Payer: Federal, State, Local not specified - PPO

## 2016-02-02 ENCOUNTER — Ambulatory Visit (HOSPITAL_COMMUNITY)
Admission: RE | Admit: 2016-02-02 | Discharge: 2016-02-02 | Disposition: A | Discharge status: Home / Self Care | Payer: BLUE CROSS/BLUE SHIELD | Source: Ambulatory Visit | Attending: Women's Health | Admitting: Women's Health

## 2016-02-02 DIAGNOSIS — D1779 Benign lipomatous neoplasm of other sites: Secondary | ICD-10-CM | POA: Diagnosis not present

## 2016-02-02 DIAGNOSIS — N6320 Unspecified lump in the left breast, unspecified quadrant: Secondary | ICD-10-CM | POA: Insufficient documentation

## 2016-02-11 ENCOUNTER — Ambulatory Visit (INDEPENDENT_AMBULATORY_CARE_PROVIDER_SITE_OTHER): Payer: BLUE CROSS/BLUE SHIELD | Admitting: Advanced Practice Midwife

## 2016-02-11 ENCOUNTER — Encounter: Payer: Self-pay | Admitting: Advanced Practice Midwife

## 2016-02-11 VITALS — BP 102/80 | HR 84 | Wt 193.0 lb

## 2016-02-11 DIAGNOSIS — O09293 Supervision of pregnancy with other poor reproductive or obstetric history, third trimester: Secondary | ICD-10-CM

## 2016-02-11 DIAGNOSIS — Z1389 Encounter for screening for other disorder: Secondary | ICD-10-CM

## 2016-02-11 DIAGNOSIS — Z3A34 34 weeks gestation of pregnancy: Secondary | ICD-10-CM

## 2016-02-11 DIAGNOSIS — Z3483 Encounter for supervision of other normal pregnancy, third trimester: Secondary | ICD-10-CM

## 2016-02-11 DIAGNOSIS — Z331 Pregnant state, incidental: Secondary | ICD-10-CM

## 2016-02-11 LAB — POCT URINALYSIS DIPSTICK
Blood, UA: NEGATIVE
GLUCOSE UA: NEGATIVE
Ketones, UA: NEGATIVE
LEUKOCYTES UA: NEGATIVE
NITRITE UA: NEGATIVE

## 2016-02-11 NOTE — Progress Notes (Signed)
[id number] [redacted]w[redacted]d Estimated Date of Delivery: 03/23/16  Blood pressure 102/80, pulse 84, weight 193 lb (87.5 kg), last menstrual period 06/17/2015.   BP weight and urine results all reviewed and noted.  Please refer to the obstetrical flow sheet for the fundal height and fetal heart rate documentation:  Patient reports good fetal movement, denies any bleeding and no rupture of membranes symptoms or regular contractions. Patient is without complaints. All questions were answered.  Orders Placed This Encounter  Procedures  . US OB Follow Up  . POCT Urinalysis Dipstick    Plan:  Continued routine obstetrical care,   Return in about 2 weeks (around 02/25/2016) for [place], US:EFW.

## 2016-02-11 NOTE — Patient Instructions (Signed)

## 2016-02-22 NOTE — L&D Delivery Note (Signed)
   Delivery Note    Pt has had explosive diarrhea for the past several hours, along with vomiting.  Isolation precautions/stool panel ordered.  She progressed nicely through labor and had a 5 minute 2nd stage.  At 7:25 AM a viable female was delivered via Vaginal, Spontaneous Delivery (Presentation:ROA  ).THere was a double tight nuchal cord, and the shoulders were not forthcoming (not dystocia, but pt was pushing w/o the aid of a contraction d/t bradycardia in the 70-80's), so the posterior (left) axilla was grasped with my index finger, and the baby was rotated clockwise into the oblique diameter.  At this point, the (now) anterior shoulder was released, and the baby delivered.  At no time was any traction placed on the baby's head.   APGAR: 8, ; weight pending .  After 1 minute, the cord was clamped and cut. 40 units of pitocin diluted in 1000cc LR was infused rapidly IV.  The placenta separated spontaneously and delivered via CCT and maternal pushing effort.  It was inspected and appears to be intact with a 3 VC.    Anesthesia:  epidural Episiotomy: None Lacerations:  none Suture Repair:  Est. Blood Loss (mL):  150  Mom to postpartum.  Baby to Couplet care / Skin to Skin.  The above was done by Marchelle Gearing under my direct supervision and assistance  CRESENZO-DISHMAN,Stacy Booth 03/25/2016, 7:33 AM

## 2016-02-25 ENCOUNTER — Encounter: Payer: Self-pay | Admitting: Women's Health

## 2016-02-25 ENCOUNTER — Ambulatory Visit (INDEPENDENT_AMBULATORY_CARE_PROVIDER_SITE_OTHER): Payer: BLUE CROSS/BLUE SHIELD

## 2016-02-25 ENCOUNTER — Ambulatory Visit (INDEPENDENT_AMBULATORY_CARE_PROVIDER_SITE_OTHER): Payer: BLUE CROSS/BLUE SHIELD | Admitting: Women's Health

## 2016-02-25 VITALS — BP 134/69 | HR 88 | Wt 196.0 lb

## 2016-02-25 DIAGNOSIS — Z1389 Encounter for screening for other disorder: Secondary | ICD-10-CM

## 2016-02-25 DIAGNOSIS — Z3403 Encounter for supervision of normal first pregnancy, third trimester: Secondary | ICD-10-CM

## 2016-02-25 DIAGNOSIS — Z331 Pregnant state, incidental: Secondary | ICD-10-CM

## 2016-02-25 DIAGNOSIS — Z3A36 36 weeks gestation of pregnancy: Secondary | ICD-10-CM | POA: Diagnosis not present

## 2016-02-25 DIAGNOSIS — Z3483 Encounter for supervision of other normal pregnancy, third trimester: Secondary | ICD-10-CM

## 2016-02-25 DIAGNOSIS — O09293 Supervision of pregnancy with other poor reproductive or obstetric history, third trimester: Secondary | ICD-10-CM

## 2016-02-25 DIAGNOSIS — O09299 Supervision of pregnancy with other poor reproductive or obstetric history, unspecified trimester: Secondary | ICD-10-CM

## 2016-02-25 LAB — POCT URINALYSIS DIPSTICK
Blood, UA: NEGATIVE
GLUCOSE UA: NEGATIVE
KETONES UA: NEGATIVE
Leukocytes, UA: NEGATIVE
Nitrite, UA: NEGATIVE
Protein, UA: NEGATIVE

## 2016-02-25 NOTE — Progress Notes (Signed)
Low-risk OB appointment [id number] [redacted]w[redacted]d Estimated Date of Delivery: 03/23/16 BP 134/69   Pulse 88   Wt 196 lb (88.9 kg)   LMP 06/17/2015 (Exact Date)   BMI 34.72 kg/m   BP, weight, and urine reviewed.  Refer to obstetrical flow sheet for FH & FHR.  Reports good fm.  Denies regular uc's, lof, vb, or uti s/s. Pressure- discussed pregnancy belt.  Reviewed today's u/s for efw d/t h/o macrosomia- efw 46%/2833g, afi 14cm. Pt states this baby feels smaller than her last. Discussed ptl s/s, fkc Plan:  Continue routine obstetrical care  F/U in 1wk for OB appointment and gbs

## 2016-02-25 NOTE — Progress Notes (Signed)
[place] 123456 wks,cephalic,fhr 123456 bpm,normal ov's bilat,post pl gr 1,afi 14 cm,EFW 2833 g 46%

## 2016-02-25 NOTE — Patient Instructions (Signed)
Call the office ([phone]) or go to [facility] if:  You begin to have strong, frequent contractions  Your water breaks.  Sometimes it is a big gush of fluid, sometimes it is just a trickle that keeps getting your panties wet or running down your legs  You have vaginal bleeding.  It is normal to have a small amount of spotting if your cervix was checked.   You don't feel your baby moving like normal.  If you don't, get you something to eat and drink and lay down and focus on feeling your baby move.  You should feel at least 10 movements in 2 hours.  If you don't, you should call the office or go to [facility].     Preterm Labor and Birth Information The normal length of a pregnancy is 39-41 weeks. Preterm labor is when labor starts before 37 completed weeks of pregnancy. What are the risk factors for preterm labor? Preterm labor is more likely to occur in women who:  Have certain infections during pregnancy such as a bladder infection, sexually transmitted infection, or infection inside the uterus (chorioamnionitis).  Have a shorter-than-normal cervix.  Have gone into preterm labor before.  Have had surgery on their cervix.  Are younger than age 17 or older than age 35.  Are African American.  Are pregnant with twins or multiple babies (multiple gestation).  Take street drugs or smoke while pregnant.  Do not gain enough weight while pregnant.  Became pregnant shortly after having been pregnant. What are the symptoms of preterm labor? Symptoms of preterm labor include:  Cramps similar to those that can happen during a menstrual period. The cramps may happen with diarrhea.  Pain in the abdomen or lower back.  Regular uterine contractions that may feel like tightening of the abdomen.  A feeling of increased pressure in the pelvis.  Increased watery or bloody mucus discharge from the vagina.  Water breaking (ruptured amniotic sac). Why is it important to  recognize signs of preterm labor? It is important to recognize signs of preterm labor because babies who are born prematurely may not be fully developed. This can put them at an increased risk for:  Long-term (chronic) heart and lung problems.  Difficulty immediately after birth with regulating body systems, including blood sugar, body temperature, heart rate, and breathing rate.  Bleeding in the brain.  Cerebral palsy.  Learning difficulties.  Death. These risks are highest for babies who are born before 34 weeks of pregnancy. How is preterm labor treated? Treatment depends on the length of your pregnancy, your condition, and the health of your baby. It may involve:  Having a stitch (suture) placed in your cervix to prevent your cervix from opening too early (cerclage).  Taking or being given medicines, such as:  Hormone medicines. These may be given early in pregnancy to help support the pregnancy.  Medicine to stop contractions.  Medicines to help mature the baby's lungs. These may be prescribed if the risk of delivery is high.  Medicines to prevent your baby from developing cerebral palsy. If the labor happens before 34 weeks of pregnancy, you may need to stay in the hospital. What should I do if I think I am in preterm labor? If you think that you are going into preterm labor, call your health care provider right away. How can I prevent preterm labor in future pregnancies? To increase your chance of having a full-term pregnancy:  Do not use any tobacco products, such   as cigarettes, chewing tobacco, and e-cigarettes. If you need help quitting, ask your health care provider.  Do not use street drugs or medicines that have not been prescribed to you during your pregnancy.  Talk with your health care provider before taking any herbal supplements, even if you have been taking them regularly.  Make sure you gain a healthy amount of weight during your pregnancy.  Watch for  infection. If you think that you might have an infection, get it checked right away.  Make sure to tell your health care provider if you have gone into preterm labor before. This information is not intended to replace advice given to you by your health care provider. Make sure you discuss any questions you have with your health care provider. Document Released: 04/30/2003 Document Revised: 07/21/2015 Document Reviewed: 07/01/2015 Elsevier Interactive Patient Education  2017 Elsevier Inc.  

## 2016-03-03 ENCOUNTER — Ambulatory Visit (INDEPENDENT_AMBULATORY_CARE_PROVIDER_SITE_OTHER): Payer: BLUE CROSS/BLUE SHIELD | Admitting: Obstetrics and Gynecology

## 2016-03-03 ENCOUNTER — Encounter: Payer: Self-pay | Admitting: Obstetrics and Gynecology

## 2016-03-03 VITALS — BP 121/71 | HR 84 | Wt 196.0 lb

## 2016-03-03 DIAGNOSIS — Z331 Pregnant state, incidental: Secondary | ICD-10-CM

## 2016-03-03 DIAGNOSIS — Z3A37 37 weeks gestation of pregnancy: Secondary | ICD-10-CM

## 2016-03-03 DIAGNOSIS — Z1389 Encounter for screening for other disorder: Secondary | ICD-10-CM

## 2016-03-03 DIAGNOSIS — Z3685 Encounter for antenatal screening for Streptococcus B: Secondary | ICD-10-CM

## 2016-03-03 DIAGNOSIS — Z3483 Encounter for supervision of other normal pregnancy, third trimester: Secondary | ICD-10-CM

## 2016-03-03 LAB — POCT URINALYSIS DIPSTICK
Blood, UA: NEGATIVE
Glucose, UA: NEGATIVE
KETONES UA: NEGATIVE
LEUKOCYTES UA: NEGATIVE
Nitrite, UA: NEGATIVE

## 2016-03-03 LAB — OB RESULTS CONSOLE GBS: STREP GROUP B AG: NEGATIVE

## 2016-03-03 NOTE — Progress Notes (Signed)
Patient ID: Stacy Booth, female   DOB: 10/27/1985, 31 y.o.   MRN: [id number] [redacted]w[redacted]d Estimated Date of Delivery: 03/23/16 LROB  Patient reports good fetal movement, denies any bleeding and no rupture of membranes symptoms or regular contractions. Patient complaints: no complaints at this time. Pt notes that this is her third pregnancy. Pt birth control methods following delivery is OCP or her husband receiving a vasectomy. Pt denies any symptoms.    Blood pressure 121/71, pulse 84, weight 196 lb (88.9 kg), last menstrual period 06/17/2015. refer to the ob flow sheet for FH and FHR, also BP, Wt, Urine results:notable for trace of protein, otherwise negative.                           Physical Examination:  General appearance - alert, well appearing, and in no distress Abdomen - FH 37 cm                   -FHR 164 soft, nontender, nondistended, no masses or organomegaly CERVIX: 1 cm, closed, posterior, and high. Normal appearing cervix without discharge or lesions  Questions were answered. Assessment: LROB G3P2002 @ [redacted]w[redacted]d  Plan:  Continued routine obstetrical care, GBS completed today  F/u in 1 week for routine OB  By signing my name below, I, Soijett Blue, attest that this documentation has been prepared under the direction and in the presence of Jonnie Kind, MD. Electronically Signed: Soijett Blue, ED Scribe. 03/03/16. 1:45 PM.  I personally performed the services described in this documentation, which was SCRIBED in my presence. The recorded information has been reviewed and considered accurate. It has been edited as necessary during review. Jonnie Kind, MD

## 2016-03-05 LAB — GC/CHLAMYDIA PROBE AMP
Chlamydia trachomatis, NAA: NEGATIVE
Neisseria gonorrhoeae by PCR: NEGATIVE

## 2016-03-07 LAB — CULTURE, BETA STREP (GROUP B ONLY): Strep Gp B Culture: NEGATIVE

## 2016-03-10 ENCOUNTER — Encounter: Payer: BLUE CROSS/BLUE SHIELD | Admitting: Women's Health

## 2016-03-15 ENCOUNTER — Ambulatory Visit (INDEPENDENT_AMBULATORY_CARE_PROVIDER_SITE_OTHER): Payer: BLUE CROSS/BLUE SHIELD | Admitting: Obstetrics & Gynecology

## 2016-03-15 ENCOUNTER — Encounter: Payer: Self-pay | Admitting: Obstetrics & Gynecology

## 2016-03-15 VITALS — BP 110/70 | HR 76 | Wt 197.0 lb

## 2016-03-15 DIAGNOSIS — Z1389 Encounter for screening for other disorder: Secondary | ICD-10-CM

## 2016-03-15 DIAGNOSIS — Z331 Pregnant state, incidental: Secondary | ICD-10-CM

## 2016-03-15 DIAGNOSIS — Z3A39 39 weeks gestation of pregnancy: Secondary | ICD-10-CM

## 2016-03-15 DIAGNOSIS — Z3483 Encounter for supervision of other normal pregnancy, third trimester: Secondary | ICD-10-CM

## 2016-03-15 LAB — POCT URINALYSIS DIPSTICK
GLUCOSE UA: NEGATIVE
KETONES UA: NEGATIVE
Leukocytes, UA: NEGATIVE
Nitrite, UA: NEGATIVE
Protein, UA: NEGATIVE
RBC UA: NEGATIVE

## 2016-03-15 NOTE — Progress Notes (Signed)
[id number] [redacted]w[redacted]d Estimated Date of Delivery: 03/23/16  Blood pressure 110/70, pulse 76, weight 197 lb (89.4 kg), last menstrual period 06/17/2015.   BP weight and urine results all reviewed and noted.  Please refer to the obstetrical flow sheet for the fundal height and fetal heart rate documentation:  Patient reports good fetal movement, denies any bleeding and no rupture of membranes symptoms or regular contractions. Patient is without complaints. All questions were answered.  Orders Placed This Encounter  Procedures  . POCT urinalysis dipstick    Plan:  Continued routine obstetrical care,   Return in about 1 week (around 03/22/2016).

## 2016-03-17 ENCOUNTER — Ambulatory Visit (INDEPENDENT_AMBULATORY_CARE_PROVIDER_SITE_OTHER): Payer: BLUE CROSS/BLUE SHIELD | Admitting: Women's Health

## 2016-03-17 ENCOUNTER — Encounter: Payer: Self-pay | Admitting: Women's Health

## 2016-03-17 VITALS — BP 120/76 | HR 77 | Wt 201.0 lb

## 2016-03-17 DIAGNOSIS — Z331 Pregnant state, incidental: Secondary | ICD-10-CM

## 2016-03-17 DIAGNOSIS — N898 Other specified noninflammatory disorders of vagina: Secondary | ICD-10-CM

## 2016-03-17 DIAGNOSIS — Z3483 Encounter for supervision of other normal pregnancy, third trimester: Secondary | ICD-10-CM

## 2016-03-17 DIAGNOSIS — O26893 Other specified pregnancy related conditions, third trimester: Secondary | ICD-10-CM

## 2016-03-17 DIAGNOSIS — Z3A39 39 weeks gestation of pregnancy: Secondary | ICD-10-CM

## 2016-03-17 DIAGNOSIS — Z1389 Encounter for screening for other disorder: Secondary | ICD-10-CM

## 2016-03-17 LAB — POCT URINALYSIS DIPSTICK
GLUCOSE UA: NEGATIVE
Ketones, UA: NEGATIVE
Nitrite, UA: NEGATIVE
Protein, UA: NEGATIVE

## 2016-03-17 LAB — POCT WET PREP (WET MOUNT)
Clue Cells Wet Prep Whiff POC: NEGATIVE
Trichomonas Wet Prep HPF POC: ABSENT

## 2016-03-17 MED ORDER — NYSTATIN-TRIAMCINOLONE 100000-0.1 UNIT/GM-% EX OINT: 1.0000 "application " | TOPICAL_OINTMENT | Freq: Two times a day (BID) | CUTANEOUS | 0 refills | Status: DC

## 2016-03-17 NOTE — Patient Instructions (Signed)
Call the office ([phone]) or go to [facility] if:  You begin to have strong, frequent contractions  Your water breaks.  Sometimes it is a big gush of fluid, sometimes it is just a trickle that keeps getting your panties wet or running down your legs  You have vaginal bleeding.  It is normal to have a small amount of spotting if your cervix was checked.   You don't feel your baby moving like normal.  If you don't, get you something to eat and drink and lay down and focus on feeling your baby move.  You should feel at least 10 movements in 2 hours.  If you don't, you should call the office or go to [facility].     Braxton Hicks Contractions Contractions of the uterus can occur throughout pregnancy. Contractions are not always a sign that you are in labor.  WHAT ARE BRAXTON HICKS CONTRACTIONS?  Contractions that occur before labor are called Braxton Hicks contractions, or false labor. Toward the end of pregnancy (32-34 weeks), these contractions can develop more often and may become more forceful. This is not true labor because these contractions do not result in opening (dilatation) and thinning of the cervix. They are sometimes difficult to tell apart from true labor because these contractions can be forceful and people have different pain tolerances. You should not feel embarrassed if you go to the hospital with false labor. Sometimes, the only way to tell if you are in true labor is for your health care provider to look for changes in the cervix. If there are no prenatal problems or other health problems associated with the pregnancy, it is completely safe to be sent home with false labor and await the onset of true labor. HOW CAN YOU TELL THE DIFFERENCE BETWEEN TRUE AND FALSE LABOR? False Labor   The contractions of false labor are usually shorter and not as hard as those of true labor.   The contractions are usually irregular.   The contractions are often felt in the front of  the lower abdomen and in the groin.   The contractions may go away when you walk around or change positions while lying down.   The contractions get weaker and are shorter lasting as time goes on.   The contractions do not usually become progressively stronger, regular, and closer together as with true labor.  True Labor   Contractions in true labor last 30-70 seconds, become very regular, usually become more intense, and increase in frequency.   The contractions do not go away with walking.   The discomfort is usually felt in the top of the uterus and spreads to the lower abdomen and low back.   True labor can be determined by your health care provider with an exam. This will show that the cervix is dilating and getting thinner.  WHAT TO REMEMBER  Keep up with your usual exercises and follow other instructions given by your health care provider.   Take medicines as directed by your health care provider.   Keep your regular prenatal appointments.   Eat and drink lightly if you think you are going into labor.   If Braxton Hicks contractions are making you uncomfortable:   Change your position from lying down or resting to walking, or from walking to resting.   Sit and rest in a tub of warm water.   Drink 2-3 glasses of water. Dehydration may cause these contractions.   Do slow and deep breathing several   times an hour.  WHEN SHOULD I SEEK IMMEDIATE MEDICAL CARE? Seek immediate medical care if:  Your contractions become stronger, more regular, and closer together.   You have fluid leaking or gushing from your vagina.   You have a fever.   You pass blood-tinged mucus.   You have vaginal bleeding.   You have continuous abdominal pain.   You have low back pain that you never had before.   You feel your baby's head pushing down and causing pelvic pressure.   Your baby is not moving as much as it used to.  This information is not intended to  replace advice given to you by your health care provider. Make sure you discuss any questions you have with your health care provider. Document Released: 02/07/2005 Document Revised: 06/01/2015 Document Reviewed: 11/19/2012 Elsevier Interactive Patient Education  2017 Elsevier Inc.  

## 2016-03-17 NOTE — Progress Notes (Signed)
[facility] Low-risk OB appointment [id number] [redacted]w[redacted]d Estimated Date of Delivery: 03/23/16 BP 120/76   Pulse 77   Wt 201 lb (91.2 kg)   LMP 06/17/2015 (Exact Date)   BMI 35.61 kg/m   BP, weight, and urine reviewed.  Refer to obstetrical flow sheet for FH & FHR.  Reports good fm.  Denies regular uc's, lof, vb, or uti s/s. Vaginal irritation and white d/c x 1-2 days. No odor.  Spec exam: Rt labia majora erythematous/irritated, cx visually closed, mod amt yellow nonodorous d/c, wet prep many wbc's, otherwise neg, will send gc/ct, and tx w/ mycolog SVE per request: 1.5/50/-2, vtx Reviewed labor s/s, fkc. Plan:  Continue routine obstetrical care  F/U as scheduled for OB appointment

## 2016-03-20 LAB — GC/CHLAMYDIA PROBE AMP
CHLAMYDIA, DNA PROBE: NEGATIVE
NEISSERIA GONORRHOEAE BY PCR: NEGATIVE

## 2016-03-24 ENCOUNTER — Inpatient Hospital Stay (HOSPITAL_COMMUNITY)
Admission: AD | Admit: 2016-03-24 | Discharge: 2016-03-26 | DRG: 775 | Disposition: A | Discharge status: Home / Self Care | Payer: BLUE CROSS/BLUE SHIELD | Source: Ambulatory Visit | Attending: Obstetrics and Gynecology | Admitting: Obstetrics and Gynecology

## 2016-03-24 ENCOUNTER — Encounter: Payer: Self-pay | Admitting: Obstetrics & Gynecology

## 2016-03-24 ENCOUNTER — Ambulatory Visit (INDEPENDENT_AMBULATORY_CARE_PROVIDER_SITE_OTHER): Payer: BLUE CROSS/BLUE SHIELD | Admitting: Obstetrics & Gynecology

## 2016-03-24 VITALS — BP 120/70 | HR 80 | Wt 204.0 lb

## 2016-03-24 DIAGNOSIS — Z1389 Encounter for screening for other disorder: Secondary | ICD-10-CM

## 2016-03-24 DIAGNOSIS — Z3A40 40 weeks gestation of pregnancy: Secondary | ICD-10-CM

## 2016-03-24 DIAGNOSIS — Z3483 Encounter for supervision of other normal pregnancy, third trimester: Secondary | ICD-10-CM

## 2016-03-24 DIAGNOSIS — Z331 Pregnant state, incidental: Secondary | ICD-10-CM

## 2016-03-24 DIAGNOSIS — Z833 Family history of diabetes mellitus: Secondary | ICD-10-CM

## 2016-03-24 DIAGNOSIS — Z87891 Personal history of nicotine dependence: Secondary | ICD-10-CM

## 2016-03-24 DIAGNOSIS — A0819 Acute gastroenteropathy due to other small round viruses: Secondary | ICD-10-CM | POA: Diagnosis not present

## 2016-03-24 DIAGNOSIS — O4202 Full-term premature rupture of membranes, onset of labor within 24 hours of rupture: Secondary | ICD-10-CM | POA: Diagnosis present

## 2016-03-24 DIAGNOSIS — O480 Post-term pregnancy: Secondary | ICD-10-CM | POA: Diagnosis not present

## 2016-03-24 LAB — POCT URINALYSIS DIPSTICK
Glucose, UA: NEGATIVE
Ketones, UA: NEGATIVE
NITRITE UA: NEGATIVE
PROTEIN UA: NEGATIVE
RBC UA: NEGATIVE

## 2016-03-24 NOTE — Progress Notes (Signed)
[id number] [redacted]w[redacted]d Estimated Date of Delivery: 03/23/16  Blood pressure 120/70, pulse 80, weight 204 lb (92.5 kg), last menstrual period 06/17/2015.   BP weight and urine results all reviewed and noted.  Please refer to the obstetrical flow sheet for the fundal height and fetal heart rate documentation:  Patient reports good fetal movement, denies any bleeding and no rupture of membranes symptoms or regular contractions. Patient is without complaints. All questions were answered.  Orders Placed This Encounter  Procedures  . POCT urinalysis dipstick    Plan:  Continued routine obstetrical care, induction scheduled for 03/30/2016@0800 , induction schedule is full , talked with Randolm Idol and approved a "direct admit" for post dates  No Follow-up on file.

## 2016-03-25 ENCOUNTER — Inpatient Hospital Stay (HOSPITAL_COMMUNITY): Payer: BLUE CROSS/BLUE SHIELD | Admitting: Anesthesiology

## 2016-03-25 ENCOUNTER — Encounter (HOSPITAL_COMMUNITY): Payer: Self-pay | Admitting: Anesthesiology

## 2016-03-25 DIAGNOSIS — O4202 Full-term premature rupture of membranes, onset of labor within 24 hours of rupture: Secondary | ICD-10-CM | POA: Diagnosis present

## 2016-03-25 DIAGNOSIS — Z87891 Personal history of nicotine dependence: Secondary | ICD-10-CM | POA: Diagnosis not present

## 2016-03-25 DIAGNOSIS — Z3A40 40 weeks gestation of pregnancy: Secondary | ICD-10-CM

## 2016-03-25 DIAGNOSIS — A0819 Acute gastroenteropathy due to other small round viruses: Secondary | ICD-10-CM | POA: Diagnosis not present

## 2016-03-25 DIAGNOSIS — Z833 Family history of diabetes mellitus: Secondary | ICD-10-CM | POA: Diagnosis not present

## 2016-03-25 LAB — GASTROINTESTINAL PANEL BY PCR, STOOL (REPLACES STOOL CULTURE)
ADENOVIRUS F40/41: NOT DETECTED
Astrovirus: NOT DETECTED
CAMPYLOBACTER SPECIES: NOT DETECTED
Cryptosporidium: NOT DETECTED
Cyclospora cayetanensis: NOT DETECTED
ENTEROAGGREGATIVE E COLI (EAEC): NOT DETECTED
ENTEROPATHOGENIC E COLI (EPEC): NOT DETECTED
ENTEROTOXIGENIC E COLI (ETEC): NOT DETECTED
Entamoeba histolytica: NOT DETECTED
GIARDIA LAMBLIA: NOT DETECTED
NOROVIRUS GI/GII: DETECTED — AB
PLESIMONAS SHIGELLOIDES: NOT DETECTED
ROTAVIRUS A: NOT DETECTED
Salmonella species: NOT DETECTED
Sapovirus (I, II, IV, and V): NOT DETECTED
Shiga like toxin producing E coli (STEC): NOT DETECTED
Shigella/Enteroinvasive E coli (EIEC): NOT DETECTED
Vibrio cholerae: NOT DETECTED
Vibrio species: NOT DETECTED
Yersinia enterocolitica: NOT DETECTED

## 2016-03-25 LAB — CBC
HCT: 35 % — ABNORMAL LOW (ref 36.0–46.0)
HEMOGLOBIN: 11.8 g/dL — AB (ref 12.0–15.0)
MCH: 29.8 pg (ref 26.0–34.0)
MCHC: 33.7 g/dL (ref 30.0–36.0)
MCV: 88.4 fL (ref 78.0–100.0)
PLATELETS: 189 10*3/uL (ref 150–400)
RBC: 3.96 MIL/uL (ref 3.87–5.11)
RDW: 14.1 % (ref 11.5–15.5)
WBC: 8 10*3/uL (ref 4.0–10.5)

## 2016-03-25 LAB — C DIFFICILE QUICK SCREEN W PCR REFLEX
C DIFFICILE (CDIFF) TOXIN: NEGATIVE
C DIFFICLE (CDIFF) ANTIGEN: NEGATIVE
C Diff interpretation: NOT DETECTED

## 2016-03-25 LAB — TYPE AND SCREEN
ABO/RH(D): A POS
Antibody Screen: NEGATIVE

## 2016-03-25 LAB — RPR: RPR: NONREACTIVE

## 2016-03-25 MED ORDER — COCONUT OIL OIL: 1.0000 "application " | TOPICAL_OIL | Status: DC | PRN

## 2016-03-25 MED ORDER — ACETAMINOPHEN 325 MG PO TABS
650.0000 mg | ORAL_TABLET | ORAL | Status: DC | PRN
  Administered 2016-03-25 (×2): 650 mg via ORAL
  Filled 2016-03-25 (×2): qty 2

## 2016-03-25 MED ORDER — LIDOCAINE HCL (PF) 1 % IJ SOLN
30.0000 mL | INTRAMUSCULAR | Status: DC | PRN
  Filled 2016-03-25: qty 30

## 2016-03-25 MED ORDER — IBUPROFEN 600 MG PO TABS
600.0000 mg | ORAL_TABLET | Freq: Four times a day (QID) | ORAL | Status: DC
  Administered 2016-03-25 – 2016-03-26 (×4): 600 mg via ORAL
  Filled 2016-03-25 (×5): qty 1

## 2016-03-25 MED ORDER — DOCUSATE SODIUM 100 MG PO CAPS
100.0000 mg | ORAL_CAPSULE | Freq: Two times a day (BID) | ORAL | Status: DC
Start: 2016-03-25 — End: 2016-03-26
  Administered 2016-03-25 – 2016-03-26 (×2): 100 mg via ORAL
  Filled 2016-03-25 (×2): qty 1

## 2016-03-25 MED ORDER — ONDANSETRON HCL 4 MG PO TABS: 4.0000 mg | ORAL_TABLET | ORAL | Status: DC | PRN

## 2016-03-25 MED ORDER — OXYCODONE HCL 5 MG PO TABS: 5.0000 mg | ORAL_TABLET | ORAL | Status: DC | PRN

## 2016-03-25 MED ORDER — METHYLERGONOVINE MALEATE 0.2 MG PO TABS: 0.2000 mg | ORAL_TABLET | ORAL | Status: DC | PRN

## 2016-03-25 MED ORDER — PHENYLEPHRINE 40 MCG/ML (10ML) SYRINGE FOR IV PUSH (FOR BLOOD PRESSURE SUPPORT)
80.0000 ug | PREFILLED_SYRINGE | INTRAVENOUS | Status: DC | PRN
  Filled 2016-03-25: qty 5

## 2016-03-25 MED ORDER — LACTATED RINGERS IV SOLN
500.0000 mL | Freq: Once | INTRAVENOUS | Status: AC
  Administered 2016-03-25: 500 mL via INTRAVENOUS

## 2016-03-25 MED ORDER — OXYCODONE HCL 5 MG PO TABS: 10.0000 mg | ORAL_TABLET | ORAL | Status: DC | PRN

## 2016-03-25 MED ORDER — LIDOCAINE HCL (PF) 1 % IJ SOLN
INTRAMUSCULAR | Status: DC | PRN
  Administered 2016-03-25 (×2): 7 mL via EPIDURAL

## 2016-03-25 MED ORDER — EPHEDRINE 5 MG/ML INJ
10.0000 mg | INTRAVENOUS | Status: DC | PRN
  Filled 2016-03-25: qty 4

## 2016-03-25 MED ORDER — FERROUS SULFATE 325 (65 FE) MG PO TABS
325.0000 mg | ORAL_TABLET | Freq: Two times a day (BID) | ORAL | Status: DC
  Administered 2016-03-25 – 2016-03-26 (×2): 325 mg via ORAL
  Filled 2016-03-25 (×2): qty 1

## 2016-03-25 MED ORDER — OXYCODONE-ACETAMINOPHEN 5-325 MG PO TABS: 1.0000 | ORAL_TABLET | ORAL | Status: DC | PRN

## 2016-03-25 MED ORDER — LOPERAMIDE HCL 2 MG PO CAPS
4.0000 mg | ORAL_CAPSULE | Freq: Once | ORAL | Status: AC
  Administered 2016-03-25: 4 mg via ORAL
  Filled 2016-03-25: qty 2

## 2016-03-25 MED ORDER — FLEET ENEMA 7-19 GM/118ML RE ENEM: 1.0000 | ENEMA | Freq: Every day | RECTAL | Status: DC | PRN

## 2016-03-25 MED ORDER — OXYCODONE-ACETAMINOPHEN 5-325 MG PO TABS: 2.0000 | ORAL_TABLET | ORAL | Status: DC | PRN

## 2016-03-25 MED ORDER — TETANUS-DIPHTH-ACELL PERTUSSIS 5-2.5-18.5 LF-MCG/0.5 IM SUSP: 0.5000 mL | Freq: Once | INTRAMUSCULAR | Status: DC

## 2016-03-25 MED ORDER — LACTATED RINGERS IV SOLN
INTRAVENOUS | Status: DC
  Administered 2016-03-25: via INTRAVENOUS

## 2016-03-25 MED ORDER — PHENYLEPHRINE 40 MCG/ML (10ML) SYRINGE FOR IV PUSH (FOR BLOOD PRESSURE SUPPORT)
80.0000 ug | PREFILLED_SYRINGE | INTRAVENOUS | Status: DC | PRN
  Filled 2016-03-25: qty 5
  Filled 2016-03-25: qty 10

## 2016-03-25 MED ORDER — FENTANYL CITRATE (PF) 100 MCG/2ML IJ SOLN
50.0000 ug | INTRAMUSCULAR | Status: DC | PRN
  Administered 2016-03-25: 50 ug via INTRAVENOUS
  Filled 2016-03-25: qty 2

## 2016-03-25 MED ORDER — PRENATAL MULTIVITAMIN CH
1.0000 | ORAL_TABLET | Freq: Every day | ORAL | Status: DC
  Administered 2016-03-26: 1 via ORAL
  Filled 2016-03-25: qty 1

## 2016-03-25 MED ORDER — WITCH HAZEL-GLYCERIN EX PADS: 1.0000 "application " | MEDICATED_PAD | CUTANEOUS | Status: DC | PRN

## 2016-03-25 MED ORDER — BENZOCAINE-MENTHOL 20-0.5 % EX AERO: 1.0000 "application " | INHALATION_SPRAY | CUTANEOUS | Status: DC | PRN

## 2016-03-25 MED ORDER — ZOLPIDEM TARTRATE 5 MG PO TABS: 5.0000 mg | ORAL_TABLET | Freq: Every evening | ORAL | Status: DC | PRN

## 2016-03-25 MED ORDER — ONDANSETRON HCL 4 MG/2ML IJ SOLN
4.0000 mg | Freq: Four times a day (QID) | INTRAMUSCULAR | Status: DC | PRN
  Administered 2016-03-25: 4 mg via INTRAVENOUS
  Filled 2016-03-25: qty 2

## 2016-03-25 MED ORDER — SIMETHICONE 80 MG PO CHEW: 80.0000 mg | CHEWABLE_TABLET | ORAL | Status: DC | PRN

## 2016-03-25 MED ORDER — FENTANYL 2.5 MCG/ML BUPIVACAINE 1/10 % EPIDURAL INFUSION (WH - ANES)
14.0000 mL/h | INTRAMUSCULAR | Status: DC | PRN
  Administered 2016-03-25: 14 mL/h via EPIDURAL
  Filled 2016-03-25: qty 100

## 2016-03-25 MED ORDER — ACETAMINOPHEN 325 MG PO TABS: 650.0000 mg | ORAL_TABLET | ORAL | Status: DC | PRN

## 2016-03-25 MED ORDER — OXYTOCIN 40 UNITS IN LACTATED RINGERS INFUSION - SIMPLE MED
2.5000 [IU]/h | INTRAVENOUS | Status: DC
  Filled 2016-03-25: qty 1000

## 2016-03-25 MED ORDER — FLEET ENEMA 7-19 GM/118ML RE ENEM: 1.0000 | ENEMA | RECTAL | Status: DC | PRN

## 2016-03-25 MED ORDER — OXYTOCIN BOLUS FROM INFUSION
500.0000 mL | Freq: Once | INTRAVENOUS | Status: AC
  Administered 2016-03-25: 500 mL via INTRAVENOUS

## 2016-03-25 MED ORDER — LOPERAMIDE HCL 2 MG PO CAPS: 2.0000 mg | ORAL_CAPSULE | ORAL | Status: DC | PRN

## 2016-03-25 MED ORDER — LACTATED RINGERS IV SOLN: 500.0000 mL | INTRAVENOUS | Status: DC | PRN

## 2016-03-25 MED ORDER — LOPERAMIDE HCL 2 MG PO CAPS
2.0000 mg | ORAL_CAPSULE | ORAL | Status: DC | PRN
  Administered 2016-03-25: 2 mg via ORAL
  Filled 2016-03-25 (×3): qty 1

## 2016-03-25 MED ORDER — DIBUCAINE 1 % RE OINT: 1.0000 "application " | TOPICAL_OINTMENT | RECTAL | Status: DC | PRN

## 2016-03-25 MED ORDER — SOD CITRATE-CITRIC ACID 500-334 MG/5ML PO SOLN: 30.0000 mL | ORAL | Status: DC | PRN

## 2016-03-25 MED ORDER — MEASLES, MUMPS & RUBELLA VAC ~~LOC~~ INJ
0.5000 mL | INJECTION | Freq: Once | SUBCUTANEOUS | Status: DC
  Filled 2016-03-25: qty 0.5

## 2016-03-25 MED ORDER — DIPHENHYDRAMINE HCL 50 MG/ML IJ SOLN: 12.5000 mg | INTRAMUSCULAR | Status: DC | PRN

## 2016-03-25 MED ORDER — METHYLERGONOVINE MALEATE 0.2 MG/ML IJ SOLN: 0.2000 mg | INTRAMUSCULAR | Status: DC | PRN

## 2016-03-25 MED ORDER — BISACODYL 10 MG RE SUPP: 10.0000 mg | Freq: Every day | RECTAL | Status: DC | PRN

## 2016-03-25 MED ORDER — DIPHENHYDRAMINE HCL 25 MG PO CAPS: 25.0000 mg | ORAL_CAPSULE | Freq: Four times a day (QID) | ORAL | Status: DC | PRN

## 2016-03-25 MED ORDER — ONDANSETRON HCL 4 MG/2ML IJ SOLN: 4.0000 mg | INTRAMUSCULAR | Status: DC | PRN

## 2016-03-25 NOTE — Anesthesia Postprocedure Evaluation (Signed)
Anesthesia Post Note  Patient: Stacy Booth  Procedure(s) Performed: * No procedures listed *  Patient location during evaluation: Mother Baby Anesthesia Type: Epidural Level of consciousness: awake Pain management: satisfactory to patient Vital Signs Assessment: post-procedure vital signs reviewed and stable Respiratory status: spontaneous breathing Cardiovascular status: stable Anesthetic complications: no        Last Vitals:  Vitals:   03/25/16 0915 03/25/16 1015  BP: 113/72 125/75  Pulse: 68 88  Resp: 18 18  Temp: 36.5 C 36.7 C    Last Pain:  Vitals:   03/25/16 1130  TempSrc:   PainSc: 3    Pain Goal:                 [employer]

## 2016-03-25 NOTE — Progress Notes (Signed)
S: Patient seen & examined for progress of labor. Patient reporting contractions of increasing intensity and frequency.    O:  Vitals:   03/25/16 0009 03/25/16 0038 03/25/16 0042  BP: 132/75 137/78   Pulse: 72 75   Resp: 18    Temp: 99.1 F (37.3 C)  97.9 F (36.6 C)  TempSrc: Oral  Oral  Weight: 204 lb (92.5 kg)    Height: 5\' 3"  (1.6 m)          FHT: 130s bpm, mod var, +accels, no decels TOCO: Difficult to pick up on the monitor, showing 3-63min contractions periodically   A/P: Plan for epidural placement Continue expectant management otherwise Anticipate SVD   Crissie Sickles, MD PGY-2 03/25/2016 3:31 AM

## 2016-03-25 NOTE — MAU Note (Signed)
Presents with possible ROM beginning approximately 2200 last night.  States she feels mild CTX approximately every 10 mins.  No Vb, good Fm.

## 2016-03-25 NOTE — Lactation Note (Signed)
This note was copied from a baby's chart. Lactation Consultation Note; Initial visit with this experienced mom. Baby now 75 hours old.  Reports baby has been feeding a lot so far today. Mom just back from bathroom- latched baby easily by herself.  Mom reports she is able to hand express a few drops of Colostrum. Assisted mom with rolled up blanket for hand support. Mom reports no pain with latch. Encouraged to keep baby close to the breast. Has pump for home. BF brochure given  Reviewed our phone number, OP appointments and [facility] as resources for support after DC.  Patient Name: Stacy Booth [id number] Date: 03/25/2016 Reason for consult: Initial assessment   Maternal Data Formula Feeding for Exclusion: No Has patient been taught Hand Expression?: Yes Does the patient have breastfeeding experience prior to this delivery?: Yes  Feeding Feeding Type: Breast Fed  LATCH Score/Interventions Latch: Grasps breast easily, tongue down, lips flanged, rhythmical sucking.  Audible Swallowing: A few with stimulation  Type of Nipple: Everted at rest and after stimulation  Comfort (Breast/Nipple): Soft / non-tender     Hold (Positioning): No assistance needed to correctly position infant at breast.  LATCH Score: 9  Lactation Tools Discussed/Used     Consult Status Consult Status: PRN    Truddie Crumble 03/25/2016, 2:19 PM

## 2016-03-25 NOTE — H&P (Signed)
Stacy Booth is a 31 y.o. female G3P2002 with IUP at 108w2d presenting for ROM at 2200. Marland Kitchen Pt states she has been having a few contractions, associated with none vaginal bleeding..  Membranes are ruptured, clear fluid, with active fetal movement.   [facility]  [facility] 8 wks. Does not want IOL if possible.  Agreed to start IOL around 7 or 8 am if not well on her way to being in labor.  Has SVE this afternoon at office and was 2 cms  Prenatal History/Complications:  Hx macrosomia EFW 46% @ 36 weeks  Past Medical History: Past Medical History:  Diagnosis Date  . Burning with urination 08/27/2015  . Hematuria 08/27/2015  . Pregnant 08/12/2015  . Urinary frequency 08/27/2015  . UTI (lower urinary tract infection) 08/27/2015    Past Surgical History: Past Surgical History:  Procedure Laterality Date  . NO PAST SURGERIES      Obstetrical History: OB History    Gravida Para Term Preterm AB Living   3 2 2     2    SAB TAB Ectopic Multiple Live Births           2       Social History: Social History   Social History  . Marital status: Single    Spouse name: N/A  . Number of children: N/A  . Years of education: N/A   Social History Main Topics  . Smoking status: Former Smoker    Years: 10.00    Types: Cigarettes    Quit date: 04/22/2011  . Smokeless tobacco: Never Used  . Alcohol use No  . Drug use: No  . Sexual activity: Yes    Birth control/ protection: None   Other Topics Concern  . Not on file   Social History Narrative  . No narrative on file    Family History: Family History  Problem Relation Age of Onset  . Diabetes Mother   . Schizophrenia Brother   . Bipolar disorder Brother   . Mental retardation Brother   . Bipolar disorder Maternal Aunt   . Depression Maternal Aunt   . Schizophrenia Maternal Grandmother   . Diabetes Maternal Grandmother   . Mental retardation Maternal Grandmother   . Diabetes Maternal Grandfather   . Alcohol abuse Paternal  Grandfather     Allergies: No Known Allergies  Prescriptions Prior to Admission  Medication Sig Dispense Refill Last Dose  . acetaminophen (TYLENOL) 500 MG tablet Take 500 mg by mouth as needed.   Taking  . nystatin-triamcinolone ointment (MYCOLOG) Apply 1 application topically 2 (two) times daily. (Patient not taking: Reported on 03/24/2016) 30 g 0 Not Taking  . Prenatal Vit-Fe Fumarate-FA (MULTIVITAMIN-PRENATAL) 27-0.8 MG TABS Take 1 tablet by mouth daily at 12 noon.   Taking        Review of Systems   Constitutional: Negative for fever and chills Eyes: Negative for visual disturbances Respiratory: Negative for shortness of breath, dyspnea Cardiovascular: Negative for chest pain or palpitations  Gastrointestinal: Negative for vomiting, diarrhea and constipation.  POSITIVE for abdominal pain (contractions) Genitourinary: Negative for dysuria and urgency Musculoskeletal: Negative for back pain, joint pain, myalgias  Neurological: Negative for dizziness and headaches      Last menstrual period 06/17/2015. General appearance: alert, cooperative and no distress Lungs: clear to auscultation bilaterally Heart: regular rate and rhythm Abdomen: soft, non-tender; bowel sounds normal Extremities: Homans sign is negative, no sign of DVT DTR's 2+ Presentation: cephalic Fetal monitoring  Baseline:  145 bpm, Variability: Good {> 6 bpm), Accelerations: Reactive and Decelerations: Absent Uterine activity  infrequent     Prenatal labs: ABO, Rh:   Antibody: Negative (11/02 0856) Rubella: immune RPR: Non Reactive (11/02 0856)  HBsAg: Negative (07/11 1210)  HIV: Non Reactive (11/02 0856)   [place]  Initiated Care at  [place]  Dating By LMP c/w 8wk u/s  Pap 09/01/2015 neg  GC/CT Initial: -/-               36+wks:-/-  Genetic Screen NT/IT: neg  CF screen declined  Anatomic [place] Normal Female, '[name]'  Flu vaccine Declined  11/26/15   Tdap Recommended ~  28wks  Glucose Screen  2 hr normal: 88/152/126  GBS neg  Feed Preference breast  Contraception condoms  Circumcision Yes, at [place] declined  Pediatrician [facility]      Results for orders placed or performed in visit on 03/24/16 (from the past 24 hour(s))  POCT urinalysis dipstick   Collection Time: 03/24/16  8:52 AM  Result Value Ref Range   Color, UA     Clarity, UA     Glucose, UA neg    Bilirubin, UA     Ketones, UA neg    Spec Grav, UA     Blood, UA neg    pH, UA     Protein, UA neg    Urobilinogen, UA     Nitrite, UA neg    Leukocytes, UA moderate (2+) (A) Negative    Assessment: Stacy Booth is a 31 y.o. [id number] with an IUP at [redacted]w[redacted]d presenting for PROM  Plan: #Labor: expectant management for now.  If not in labor in 8 hours, will start IOL (foley/cytotec) #Pain:  Per request #FWB Cat 1  CRESENZO-DISHMAN,[name] 03/25/2016, 12:10 AM

## 2016-03-25 NOTE — Progress Notes (Signed)
Received critical lab value of pt having norovirus.  Derrill Memo CNM was notified and pt started on enteric and droplet precautions.

## 2016-03-25 NOTE — Anesthesia Preprocedure Evaluation (Signed)
Anesthesia Evaluation  Patient identified by MRN, date of birth, ID band Patient awake    Reviewed: Allergy & Precautions, H&P , NPO status , Patient's Chart, lab work & pertinent test results  Airway Mallampati: II  TM Distance: >3 FB Neck ROM: full    Dental no notable dental hx.    Pulmonary neg pulmonary ROS, former smoker,    Pulmonary exam normal        Cardiovascular negative cardio ROS Normal cardiovascular exam     Neuro/Psych negative neurological ROS     GI/Hepatic negative GI ROS, Neg liver ROS,   Endo/Other  negative endocrine ROS  Renal/GU negative Renal ROS     Musculoskeletal negative musculoskeletal ROS (+)   Abdominal (+) + obese,   Peds  Hematology negative hematology ROS (+)   Anesthesia Other Findings   Reproductive/Obstetrics (+) Pregnancy                             Anesthesia Physical Anesthesia Plan  ASA: II  Anesthesia Plan: Epidural   Post-op Pain Management:    Induction:   Airway Management Planned:   Additional Equipment:   Intra-op Plan:   Post-operative Plan:   Informed Consent: I have reviewed the patients History and Physical, chart, labs and discussed the procedure including the risks, benefits and alternatives for the proposed anesthesia with the patient or authorized representative who has indicated his/her understanding and acceptance.     Plan Discussed with:   Anesthesia Plan Comments:         Anesthesia Quick Evaluation

## 2016-03-25 NOTE — Anesthesia Procedure Notes (Signed)
Epidural Patient location during procedure: OB Start time: 03/25/2016 3:24 AM End time: 03/25/2016 3:27 AM  Staffing Anesthesiologist: Lyn Hollingshead Performed: anesthesiologist   Preanesthetic Checklist Completed: patient identified, surgical consent, pre-op evaluation, timeout performed, IV checked, risks and benefits discussed and monitors and equipment checked  Epidural Patient position: sitting Prep: site prepped and draped and DuraPrep Patient monitoring: continuous pulse ox and blood pressure Approach: midline Location: L3-L4 Injection technique: LOR air  Needle:  Needle type: Tuohy  Needle gauge: 17 G Needle length: 9 cm and 9 Needle insertion depth: 5 cm cm Catheter type: closed end flexible Catheter size: 19 Gauge Catheter at skin depth: 10 cm Test dose: negative and Other  Assessment Sensory level: T9 Events: blood not aspirated, injection not painful, no injection resistance, negative IV test and no paresthesia  Additional Notes Reason for block:procedure for pain

## 2016-03-26 MED ORDER — IBUPROFEN 600 MG PO TABS: 600.0000 mg | ORAL_TABLET | Freq: Four times a day (QID) | ORAL | 0 refills | Status: AC

## 2016-03-26 NOTE — Discharge Summary (Signed)
OB Discharge Summary     Patient Name: Stacy Booth DOB: 1985/11/24 MRN: [id number]  Date of admission: 03/24/2016 Delivering MD: Christin Fudge   Date of discharge: 03/26/2016  Admitting diagnosis: [redacted]w[redacted]d, Possible [place]  Intrauterine pregnancy: [redacted]w[redacted]d     Secondary diagnosis:  Principal Problem:   SVD (spontaneous vaginal delivery)  Additional problems:  Positive norovirus during labor     Discharge diagnosis: Term Pregnancy Delivered                                                                                                Post partum procedures:None  Augmentation: None  Complications: None  Hospital course:  Onset of Labor With Vaginal Delivery     31 y.o. yo G3P3003 at [redacted]w[redacted]d was admitted in Latent Labor on 03/24/2016. Patient had an uncomplicated labor course as follows:  Membrane Rupture Time/Date: 10:15 PM ,03/24/2016   Intrapartum Procedures: Episiotomy: None [1]                                         Lacerations:     Patient had a delivery of a Viable infant. 03/25/2016  Information for the patient's newborn:  Stacy Booth, Stacy Booth [id number]       Pateint had a post partum course complicated by norovirus. She reports no BM or vomiting in the past 12 hours.  She is ambulating, tolerating a regular diet, passing flatus, and urinating well. Patient is discharged home in stable condition on 03/26/16.   Physical exam  Vitals:   03/25/16 1414 03/25/16 1821 03/25/16 2210 03/26/16 0539  BP: 123/61 (!) 120/56 123/75 131/85  Pulse: 91 97 82 82  Resp: 18 18 18 18   Temp: 98.7 F (37.1 C) 99.3 F (37.4 C) 98.9 F (37.2 C) 97.7 F (36.5 C)  TempSrc: Oral Oral Oral   SpO2:      Weight:      Height:       General: alert, cooperative and no distress Lochia: appropriate Uterine Fundus: firm Incision: N/A DVT Evaluation: No evidence of DVT seen on physical exam. No significant calf/ankle edema. Labs: Lab Results  Component Value Date   WBC 8.0  03/25/2016   HGB 11.8 (L) 03/25/2016   HCT 35.0 (L) 03/25/2016   MCV 88.4 03/25/2016   PLT 189 03/25/2016   No flowsheet data found.  Discharge instruction: per After Visit Summary and "Baby and Me Booklet".  After visit meds:  Allergies as of 03/26/2016   No Known Allergies     Medication List    TAKE these medications   acetaminophen 500 MG tablet Commonly known as:  TYLENOL Take 500 mg by mouth every 8 (eight) hours as needed for mild pain or headache.   ibuprofen 600 MG tablet Commonly known as:  ADVIL,MOTRIN Take 1 tablet (600 mg total) by mouth every 6 (six) hours.   multivitamin-prenatal 27-0.8 MG Tabs tablet Take 1 tablet by mouth daily at 12 noon.       Diet:  routine diet  Activity: Advance as tolerated. Pelvic rest for 6 weeks.   Outpatient follow up:6 weeks Follow up Appt:Future Appointments Date Time Provider [place]  05/05/2016 10:00 AM Roma Schanz, CNM [facility]   Follow up Visit:No Follow-up on file.  Postpartum contraception: Vasectomy  Newborn Data: Live born female  Birth Weight: 8 lb 4.6 oz (3759 g) APGAR: 8, 9  Baby Feeding: Breast Disposition:home with mother   03/26/2016 Crissie Sickles, MD  OB FELLOW DISCHARGE ATTESTATION  I have seen and examined this patient and agree with above documentation in the resident's note.   Stacy Doe, MD 10:03 AM

## 2016-03-26 NOTE — Clinical Social Work Maternal (Signed)
  CLINICAL SOCIAL WORK MATERNAL/CHILD NOTE  Patient Details  Name: Stacy Booth MRN: [id number] Date of Birth: 31-Aug-1985  Date:  03/26/2016  Clinical Social Worker Initiating Note:  Ferdinand Lango Marion Rosenberry, MSW, LCSW-A  Date/ Time Initiated:  03/26/16/1338     Child's Name:  Stacy Booth    Legal Guardian:  Other (Comment) (Not established by court system; MOB and FOB parent collectively )   Need for Interpreter:  None   Date of Referral:  03/25/16     Reason for Referral:  Other (Comment) (MOBs hx of anxiety / depression )   Referral Source:  RN   Address:  [address] [place], [place] [address]  Phone number:  [phone]   Household Members:  Self, Minor Children, Significant Other   Natural Supports (not living in the home):  Parent, Immediate Family, Friends, Extended Family   Professional Supports: None   Employment:     Type of Work:     Education:      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   Strengths:  Ability to meet basic needs , Pediatrician chosen , Compliance with medical plan , Home prepared for child  ([facility] )   Risk Factors/Current Problems:  None   Cognitive State:  Able to Concentrate , Alert , Goal Oriented , Insightful    Mood/Affect:  Calm , Comfortable , Interested    CSW Assessment: CSW met with MOB at bedside to complete assessment for consult regarding hx of anxiety/depression. At this time, MOB was in bed with baby bonding. Upon this writers arrival, MOB was very warm and inviting. This Probation officer explained role and reasoning for visit. At this time, MOB notes she has dealt with anxiety/depression off and on for many years. MOB notes it is very mild and has always been self-managaeble. MOB notes never being on medication or in therapy. She further notes she has never experienced PPD. This Probation officer praised MOB for  being able to use self-taught coping skills to manage her anxious/depressive symptoms. MOB notes she has a good support system that helps a lot and that when she does experience it, it is very mild. CSW further assessed for additional psychosocial needs. MOB denies any needs noting she has the home prepared and pediatrician chosen. This Probation officer educated MOB on PPD and safe sleeping/SIDS. MOB verbalized understanding. At this time, CSW has no barriers to d/c. Case closed to this CSW.   CSW Plan/Description:  Engineer, mining , No Further Intervention Required/No Barriers to Discharge    Oda Cogan, MSW, [facility]  Office: [phone]

## 2016-03-26 NOTE — Discharge Instructions (Signed)

## 2016-05-05 ENCOUNTER — Ambulatory Visit (INDEPENDENT_AMBULATORY_CARE_PROVIDER_SITE_OTHER): Payer: BLUE CROSS/BLUE SHIELD | Admitting: Women's Health

## 2016-05-05 ENCOUNTER — Encounter: Payer: Self-pay | Admitting: Women's Health

## 2016-05-05 DIAGNOSIS — F320 Major depressive disorder, single episode, mild: Secondary | ICD-10-CM

## 2016-05-05 DIAGNOSIS — F32A Depression, unspecified: Secondary | ICD-10-CM

## 2016-05-05 NOTE — Progress Notes (Signed)
Subjective:    Stacy Booth is a 31 y.o. G52P3003 Caucasian female who presents for a postpartum visit. She is 6 weeks postpartum following a spontaneous vaginal delivery at 40.2 gestational weeks. Anesthesia: epidural. I have fully reviewed the prenatal and intrapartum course. Postpartum course has been uncomplicated. Baby's course has been uncomplicated. Baby is feeding by breast. Bleeding no bleeding. Bowel function is normal. Bladder function is normal. Patient is not sexually active. Last sexual activity: prior to birth of baby. Contraception method is plans condoms. Postpartum depression screening: negative. Score 8. No problems sleeping/eating, still finds joy in things she used to, denies SI/HI/II.  h/o depression/anxiety prior to pregnancy, no meds. Last pap 09/01/15 and was neg.  The following portions of the patient's history were reviewed and updated as appropriate: allergies, current medications, past medical history, past surgical history and problem list.  Review of Systems Pertinent items are noted in HPI.   Vitals:   05/05/16 1008  BP: 108/70  Pulse: 68  Weight: 179 lb (81.2 kg)  Height: 5\' 3"  (1.6 m)   Patient's last menstrual period was 06/17/2015 (exact date).  Objective:   General:  alert, cooperative and no distress   Breasts:  deferred, no complaints  Lungs: clear to auscultation bilaterally  Heart:  regular rate and rhythm  Abdomen: soft, nontender   Vulva: normal  Vagina: normal vagina  Cervix:  closed  Corpus: Well-involuted  Adnexa:  Non-palpable  Rectal Exam: no hemorrhoids        Assessment:   Postpartum exam 6 wks s/p SVB Breastfeeding Depression screening Contraception counseling   Plan:  Contraception: condoms Follow up in: July  for physical, or earlier if needed  Tawnya Crook CNM, [facility] 05/05/2016 10:15 AM

## 2016-09-05 ENCOUNTER — Encounter: Payer: Self-pay | Admitting: Women's Health

## 2016-09-05 ENCOUNTER — Ambulatory Visit (INDEPENDENT_AMBULATORY_CARE_PROVIDER_SITE_OTHER): Payer: BLUE CROSS/BLUE SHIELD | Admitting: Women's Health

## 2016-09-05 VITALS — BP 118/70 | HR 69 | Ht 63.0 in | Wt 183.0 lb

## 2016-09-05 DIAGNOSIS — R350 Frequency of micturition: Secondary | ICD-10-CM

## 2016-09-05 DIAGNOSIS — Z01419 Encounter for gynecological examination (general) (routine) without abnormal findings: Secondary | ICD-10-CM | POA: Diagnosis not present

## 2016-09-05 NOTE — Progress Notes (Signed)
Subjective:   Stacy Booth is a 31 y.o. G3P3003 Caucasian female here for a routine well-woman exam.  No LMP recorded.   83mths s/p SVB, breastfeeding.  Current complaints: urinary frequency, only small amt comes out, intermittent dysuria x ~2wks PCP: none       Does desire labs, declines gc/ct  Social History: Sexual: heterosexual Marital Status: married Living situation: with family Occupation: homemaker Tobacco/alcohol: none Illicit drugs: no history of illicit drug use  The following portions of the patient's history were reviewed and updated as appropriate: allergies, current medications, past family history, past medical history, past social history, past surgical history and problem list.  Past Medical History Past Medical History:  Diagnosis Date  . Burning with urination 08/27/2015  . Hematuria 08/27/2015  . Pregnant 08/12/2015  . Urinary frequency 08/27/2015  . UTI (lower urinary tract infection) 08/27/2015    Past Surgical History Past Surgical History:  Procedure Laterality Date  . NO PAST SURGERIES      Gynecologic History G3P3003  No LMP recorded. Contraception: condoms Last Pap: 09/01/15. Results were: normal Last mammogram: never. Results were: Breast u/s 02/02/16 for lump Lt breast>benign lipoma, recommended f/u at 31yo for screening mammo Last TCS: never  Obstetric History OB History  Gravida Para Term Preterm AB Living  3 3 3     3   SAB TAB Ectopic Multiple Live Births        0 3    # Outcome Date GA Lbr Len/2nd Weight Sex Delivery Anes PTL Lv  3 Term 03/25/16 [redacted]w[redacted]d 06:48 / 00:37 8 lb 4.6 oz (3.759 kg) M Vag-Spont EPI  LIV  2 Term 02/02/13 [redacted]w[redacted]d 19:01 / 00:46 9 lb 4.2 oz (4.2 kg) M Vag-Spont EPI N LIV  1 Term 04/01/11 [redacted]w[redacted]d  8 lb (3.629 kg) M Vag-Spont EPI N LIV      Current Medications Current Outpatient Prescriptions on File Prior to Visit  Medication Sig Dispense Refill  . acetaminophen (TYLENOL) 500 MG tablet Take 500 mg by mouth every 8 (eight)  hours as needed for mild pain or headache.     . Prenatal Vit-Fe Fumarate-FA (MULTIVITAMIN-PRENATAL) 27-0.8 MG TABS Take 1 tablet by mouth daily at 12 noon.    Marland Kitchen ibuprofen (ADVIL,MOTRIN) 600 MG tablet Take 1 tablet (600 mg total) by mouth every 6 (six) hours. (Patient not taking: Reported on 09/05/2016) 30 tablet 0   No current facility-administered medications on file prior to visit.     Review of Systems Patient denies any headaches, blurred vision, shortness of breath, chest pain, abdominal pain, problems with bowel movements, urination, or intercourse.  Objective:  BP 118/70 (BP Location: Right Arm, Patient Position: Sitting, Cuff Size: Normal)   Pulse 69   Ht 5\' 3"  (1.6 m)   Wt 183 lb (83 kg)   Breastfeeding? Yes   BMI 32.42 kg/m  Physical Exam  General:  Well developed, well nourished, no acute distress. She is alert and oriented x3. Skin:  Warm and dry Neck:  Midline trachea, no thyromegaly or nodules Cardiovascular: Regular rate and rhythm, no murmur heard Lungs:  Effort normal, all lung fields clear to auscultation bilaterally Breasts:  Rt: No dominant palpable mass, retraction, or nipple discharge Lt: ~4cm soft round tissue ~11o'clock near sternum but in breast tissue c/w lipoma noted on u/s Dec 2017, no changes Abdomen:  Soft, non tender, no hepatosplenomegaly or masses Pelvic:  External genitalia is normal in appearance.  The vagina is normal in appearance. The cervix is  bulbous, no CMT.  Thin prep pap is not done. Uterus is felt to be normal size, shape, and contour.  No adnexal masses or tenderness noted. Extremities:  No swelling or varicosities noted Psych:  She has a normal mood and affect  Assessment:   Healthy well-woman exam Known Lt breast lipoma Urinary frequency  Plan:  Send urine cx, push po fluids CBC, CMP, TSH today per pt request F/U 68yr for physical, or sooner if needed Mammogram @29yo  or sooner if problems Colonoscopy @31yo  or sooner if  problems  Tawnya Crook CNM, [facility] 09/05/2016 9:08 AM

## 2016-09-05 NOTE — Patient Instructions (Addendum)
Drink lots of water, enough so your urine is light yellow to clear  Primary Care Providers  Dr. Wende Neighbors [place]) [facility] [phone]  Primary Wellness Care [facility]) Dr. Anastasio Champion [phone]  [facility] [place]) [phone]  [facility]) [place] [phone]

## 2016-09-07 LAB — COMPREHENSIVE METABOLIC PANEL
ALBUMIN: 4.5 g/dL (ref 3.5–5.5)
ALK PHOS: 75 IU/L (ref 39–117)
ALT: 16 IU/L (ref 0–32)
AST: 18 IU/L (ref 0–40)
Albumin/Globulin Ratio: 1.9 (ref 1.2–2.2)
BUN / CREAT RATIO: 25 — AB (ref 9–23)
BUN: 17 mg/dL (ref 6–20)
CHLORIDE: 101 mmol/L (ref 96–106)
CO2: 21 mmol/L (ref 20–29)
CREATININE: 0.68 mg/dL (ref 0.57–1.00)
Calcium: 9.1 mg/dL (ref 8.7–10.2)
GFR calc Af Amer: 137 mL/min/{1.73_m2} (ref >59)
GFR calc non Af Amer: 119 mL/min/{1.73_m2} (ref >59)
GLOBULIN, TOTAL: 2.4 g/dL (ref 1.5–4.5)
Glucose: 72 mg/dL (ref 65–99)
POTASSIUM: 4.1 mmol/L (ref 3.5–5.2)
SODIUM: 140 mmol/L (ref 134–144)
Total Protein: 6.9 g/dL (ref 6.0–8.5)

## 2016-09-07 LAB — CBC
HEMATOCRIT: 36.6 % (ref 34.0–46.6)
Hemoglobin: 12 g/dL (ref 11.1–15.9)
MCH: 28.4 pg (ref 26.6–33.0)
MCHC: 32.8 g/dL (ref 31.5–35.7)
MCV: 87 fL (ref 79–97)
Platelets: 230 10*3/uL (ref 150–379)
RBC: 4.23 x10E6/uL (ref 3.77–5.28)
RDW: 14 % (ref 12.3–15.4)
WBC: 4 10*3/uL (ref 3.4–10.8)

## 2016-09-07 LAB — TSH: TSH: 2.3 u[IU]/mL (ref 0.450–4.500)

## 2016-09-07 LAB — URINE CULTURE

## 2016-10-31 ENCOUNTER — Telehealth: Payer: Self-pay | Admitting: *Deleted

## 2016-10-31 NOTE — Telephone Encounter (Signed)
Pt called stating that her son had been diagnosed with yeast and she is breastfeeding. She states that she has been having shooting pains, some redness, and white patches on her nipples/areola area. Pt requests something to help with this. Advised pt that she could try OTC Monistat-7. Advised her to wash it off before feeding baby. Advised her to call us back if s/s do not improve or worsen. Pt verbalized understanding.

## 2017-12-03 IMAGING — US US BREAST*L* LIMITED INC AXILLA
1 series · 5 of 5 positions shown · non-contrast
Comparison: Previous exam(s).

CLINICAL DATA: 30-year-old patient in third trimester of pregnancy.
She palpates a lump in the far upper inner quadrant of the left
breast/chest wall, felt best when she is sitting upright. She states
that she had this area evaluated at [REDACTED] several years ago. She does not think than the mass has
significantly changed.

EXAM:
ULTRASOUND OF THE LEFT BREAST

[Series 1: us breast*left* limited inc axilla · 0.07mm/px · 5 of 5 slices shown]
[im 1/5]
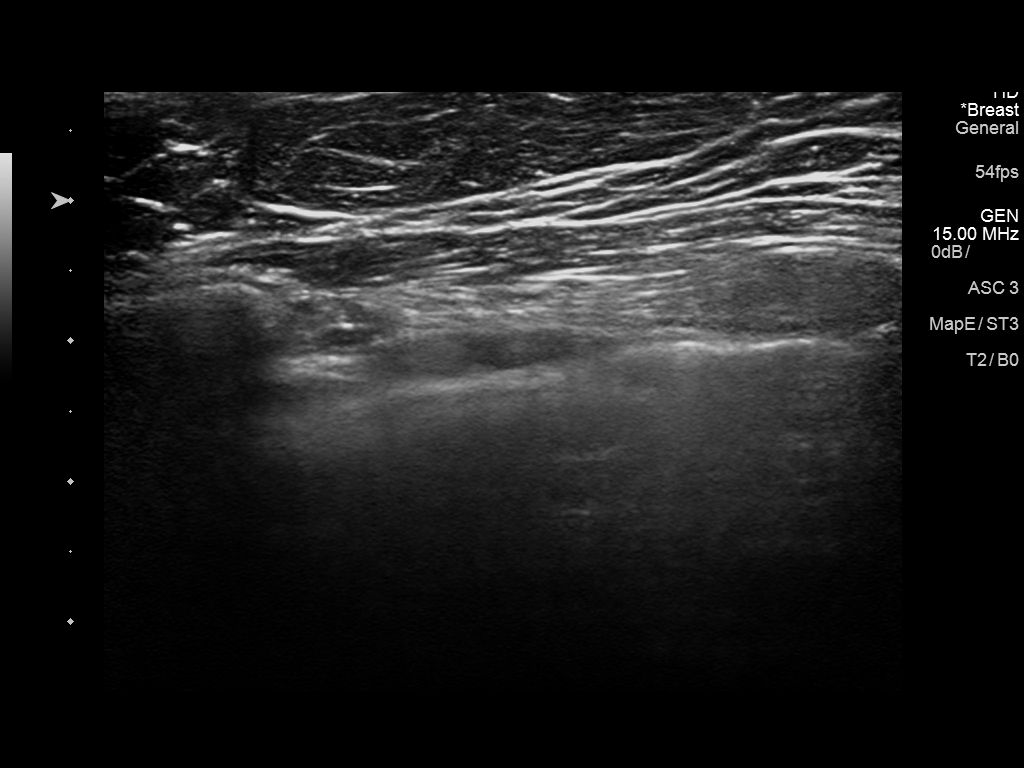
[im 2/5]
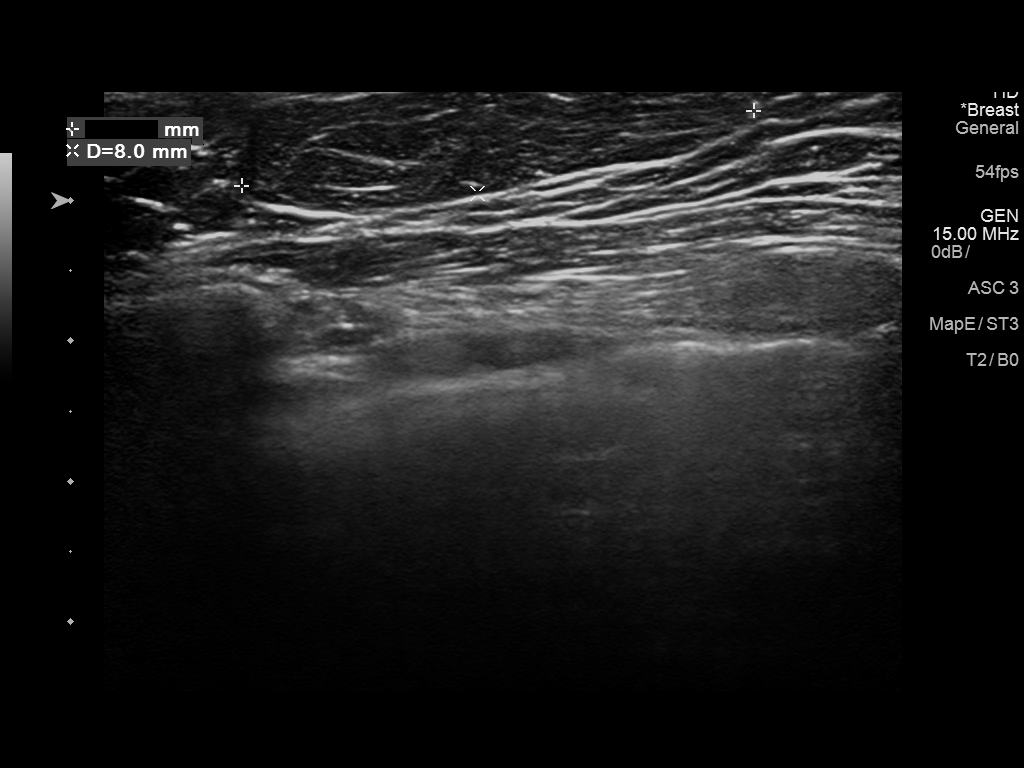
[im 3/5]
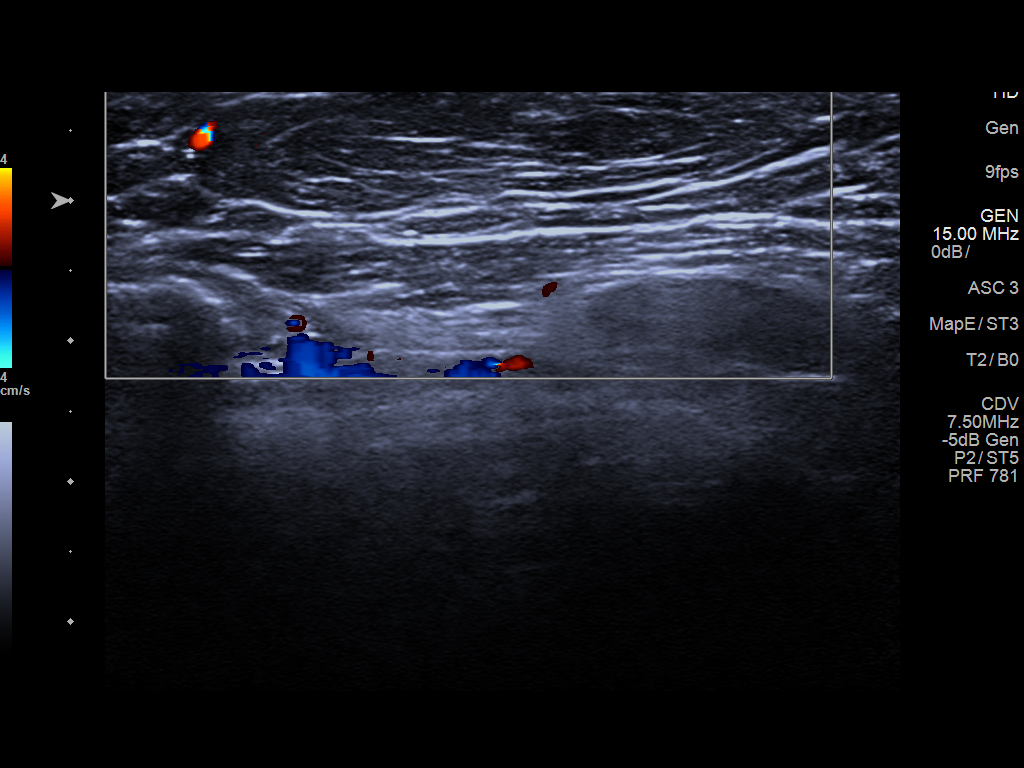
[im 4/5]
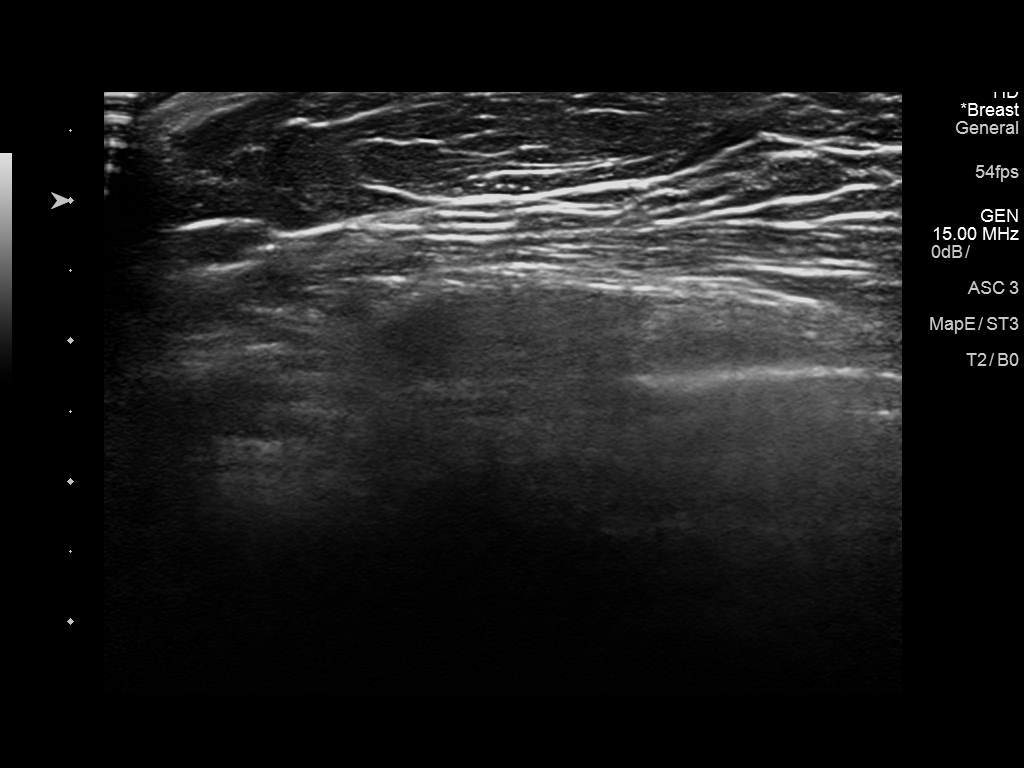
[im 5/5]
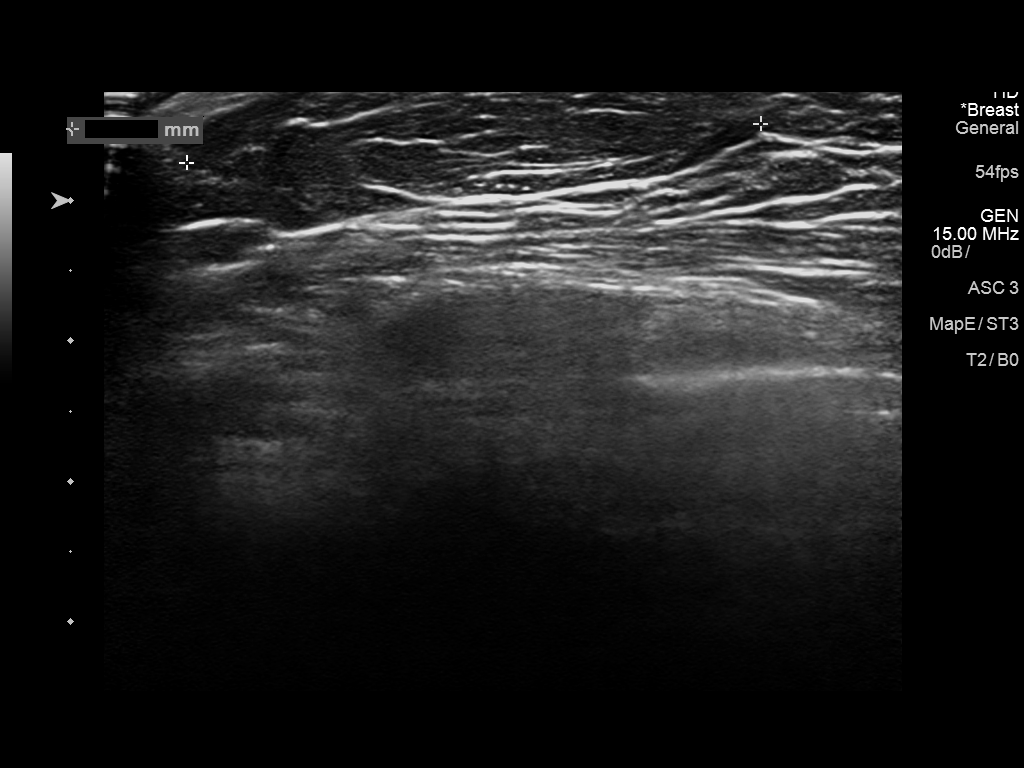

[5 of 5 positions shown; findings below may reference images not displayed]

FINDINGS: On physical exam, there is an elongate area of very soft fullness in
the left breast [DATE] position 12 cm from the nipple, near the
junction of the chest wall and breast.

Targeted ultrasound is performed, showing a circumscribed oval mass
isoechoic to subcutaneous fat, positioned between the skin and the
pectoralis muscle that measures 3.7 x 0.8 x 4.1 cm. There is no
internal vascular flow. Imaging features are compatible with benign
lipoma.
IMPRESSION: Benign lipoma in the left breast [DATE] position 12 cm from the
nipple. No evidence of malignancy.

RECOMMENDATION:
Screening mammogram at age 40 unless there are persistent or
intervening clinical concerns. (Code:X8-U-QP2)

I have discussed the findings and recommendations with the patient.
Results were also provided in writing at the conclusion of the
visit. If applicable, a reminder letter will be sent to the patient
regarding the next appointment.

BI-RADS CATEGORY  2: Benign.

## 2019-06-18 ENCOUNTER — Other Ambulatory Visit: Payer: Self-pay

## 2019-06-18 ENCOUNTER — Ambulatory Visit: Payer: Medicaid Other | Attending: Internal Medicine

## 2019-06-18 DIAGNOSIS — Z20822 Contact with and (suspected) exposure to covid-19: Secondary | ICD-10-CM

## 2019-06-19 LAB — NOVEL CORONAVIRUS, NAA: SARS-CoV-2, NAA: NOT DETECTED

## 2019-06-19 LAB — SARS-COV-2, NAA 2 DAY TAT

## 2020-10-30 ENCOUNTER — Ambulatory Visit (INDEPENDENT_AMBULATORY_CARE_PROVIDER_SITE_OTHER): Payer: Commercial Managed Care - PPO | Admitting: Family Medicine

## 2020-10-30 ENCOUNTER — Other Ambulatory Visit: Payer: Self-pay

## 2020-10-30 ENCOUNTER — Encounter: Payer: Self-pay | Admitting: Family Medicine

## 2020-10-30 VITALS — BP 119/72 | HR 76 | Temp 99.2°F | Ht 63.0 in | Wt 119.2 lb

## 2020-10-30 DIAGNOSIS — Z7689 Persons encountering health services in other specified circumstances: Secondary | ICD-10-CM

## 2020-10-30 DIAGNOSIS — F1111 Opioid abuse, in remission: Secondary | ICD-10-CM

## 2020-10-30 DIAGNOSIS — F322 Major depressive disorder, single episode, severe without psychotic features: Secondary | ICD-10-CM | POA: Insufficient documentation

## 2020-10-30 DIAGNOSIS — F411 Generalized anxiety disorder: Secondary | ICD-10-CM | POA: Diagnosis not present

## 2020-10-30 DIAGNOSIS — F339 Major depressive disorder, recurrent, unspecified: Secondary | ICD-10-CM

## 2020-10-30 DIAGNOSIS — Z1159 Encounter for screening for other viral diseases: Secondary | ICD-10-CM

## 2020-10-30 DIAGNOSIS — F1211 Cannabis abuse, in remission: Secondary | ICD-10-CM | POA: Diagnosis not present

## 2020-10-30 DIAGNOSIS — Z0000 Encounter for general adult medical examination without abnormal findings: Secondary | ICD-10-CM

## 2020-10-30 DIAGNOSIS — Z114 Encounter for screening for human immunodeficiency virus [HIV]: Secondary | ICD-10-CM

## 2020-10-30 NOTE — Progress Notes (Signed)
New Patient Office Visit  Subjective:  Patient ID: Stacy Booth, female    DOB: 11/05/1985  Age: 35 y.o. MRN: [id number]  CC:  Chief Complaint  Patient presents with   New Patient (Initial Visit)    HPI JAYDEN RUDGE presents to establish care. She reports a history of anxiety, narcotic abuse, and marijuana abuse. She has been sober for 28 days now. She was initially prescribed narcotics for her back pain. She noticed that this helped with her anxiety and then it spiraled out of control. She was recently prescribed zoloft for anxiety and depression but stopped taking this after about 1 week because she did not like how it made her feel. She has been prescribed another medication in the past for anxiety but remembers that she did not tolerate it. She cannot remember the name of that medication. She really would like to focus on lifestyle changes and factor to control her anxiety and depression. She is not interested in medication at this time. She plans to start counseling as well.   Depression screen [facility] 2/9 10/30/2020 09/05/2016  Decreased Interest 0 0  Down, Depressed, Hopeless 0 0  PHQ - 2 Score 0 0  Altered sleeping 3 -  Tired, decreased energy 2 -  Change in appetite 2 -  Feeling bad or failure about yourself  1 -  Trouble concentrating 3 -  Moving slowly or fidgety/restless 2 -  Suicidal thoughts 0 -  PHQ-9 Score 13 -  Difficult doing work/chores Somewhat difficult -   GAD 7 : Generalized Anxiety Score 10/30/2020  Nervous, Anxious, on Edge 2  Control/stop worrying 2  Worry too much - different things 2  Trouble relaxing 0  Restless 2  Easily annoyed or irritable 1  Afraid - awful might happen 1  Total GAD 7 Score 10  Anxiety Difficulty Somewhat difficult      Past Medical History:  Diagnosis Date   Anxiety    Burning with urination 08/27/2015   Hematuria 08/27/2015   Pregnant 08/12/2015   Urinary frequency 08/27/2015   UTI (lower urinary tract infection) 08/27/2015     Past Surgical History:  Procedure Laterality Date   NO PAST SURGERIES      Family History  Problem Relation Age of Onset   Heart disease Mother    COPD Mother    Asthma Mother    Arthritis Mother    Anxiety disorder Mother    Alcohol abuse Mother    Diabetes Mother    Arthritis Father    Alcohol abuse Father    Anxiety disorder Brother    ADD / ADHD Brother    Schizophrenia Brother    Bipolar disorder Brother    Mental retardation Brother    Bipolar disorder Maternal Aunt    Depression Maternal Aunt    Schizophrenia Maternal Grandmother    Diabetes Maternal Grandmother    Mental retardation Maternal Grandmother    Diabetes Maternal Grandfather    Alcohol abuse Paternal Grandfather     Social History   Socioeconomic History   Marital status: Legally Separated    Spouse name: Not on file   Number of children: 3   Years of education: 16   Highest education level: Bachelor's degree (e.g., BA, AB, BS)  Occupational History   Not on file  Tobacco Use   Smoking status: Every Day    Packs/day: 1.00    Years: 15.00    Pack years: 15.00    Types: Cigarettes  Smokeless tobacco: Never  Vaping Use   Vaping Use: Never used  Substance and Sexual Activity   Alcohol use: No   Drug use: Not Currently    Types: Marijuana, Other-see comments    Comment: narcotics   Sexual activity: Not Currently    Birth control/protection: None  Other Topics Concern   Not on file  Social History Narrative   Not on file   Social Determinants of Health   Financial Resource Strain: Not on file  Food Insecurity: Not on file  Transportation Needs: Not on file  Physical Activity: Not on file  Stress: Not on file  Social Connections: Not on file  Intimate Partner Violence: Not on file    ROS Review of Systems As per HPI.   Objective:   Today's Vitals: BP 119/72   Pulse 76   Temp 99.2 F (37.3 C) (Temporal)   Ht _0  (1.6 m)   Wt 119 lb 4 oz (54.1 kg)   BMI 21.12 kg/m    Physical Exam Vitals and nursing note reviewed.  Constitutional:      General: She is not in acute distress.    Appearance: Normal appearance. She is not ill-appearing, toxic-appearing or diaphoretic.  HENT:     Head: Normocephalic and atraumatic.  Cardiovascular:     Rate and Rhythm: Normal rate and regular rhythm.     Heart sounds: Normal heart sounds. No murmur heard. Pulmonary:     Effort: Pulmonary effort is normal. No respiratory distress.     Breath sounds: Normal breath sounds.  Musculoskeletal:     Right lower leg: No edema.     Left lower leg: No edema.  Skin:    General: Skin is warm and dry.  Neurological:     General: No focal deficit present.     Mental Status: She is alert and oriented to person, place, and time.  Psychiatric:        Mood and Affect: Mood normal.        Behavior: Behavior normal.    Assessment & Plan:   Brandyce was seen today for new patient (initial visit).  Diagnoses and all orders for this visit:  Well adult exam Labs pending as below.  -     CBC with Differential/Platelet -     CMP14+EGFR -     Lipid panel -     TSH  Depression, recurrent (HCC) Generalized anxiety disorder PHQ-9 score of 13, however negative response for first 2 questions. GAD score of 10. Declined medication today. Discussed lifestyle changes and coping mechanisms. She would like to seek counseling services on her own, declined referral today. Handouts given.   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 Praised for sobriety.   Encounter for screening for HIV -     HIV Antibody (routine testing w rflx)  Need for hepatitis C screening test -     Hepatitis C antibody  Encounter to establish care Reviewed available records.    Follow-up: Return in about 6 months (around 04/29/2021) for chronic follow up with pap.   The patient indicates understanding of these issues and agrees with the plan.  Gwenlyn Perking, FNP

## 2020-10-30 NOTE — Patient Instructions (Signed)
Managing Anxiety, Adult After being diagnosed with an anxiety disorder, you may be relieved to know why you have felt or behaved a certain way. You may also feel overwhelmed about the treatment ahead and what it will mean for your life. With care and support, you can manage this condition and recover from it. How to manage lifestyle changes Managing stress and anxiety Stress is your body's reaction to life changes and events, both good and bad. Most stress will last just a few hours, but stress can be ongoing and can lead to more than just stress. Although stress can play a major role in anxiety, it is not the same as anxiety. Stress is usually caused by something external, such as a deadline, test, or competition. Stress normally passes after the triggering event has ended.  Anxiety is caused by something internal, such as imagining a terrible outcome or worrying that something will go wrong that will devastate you. Anxiety often does not go away even after the triggering event is over, and it can become long-term (chronic) worry. It is important to understand the differences between stress and anxiety and to manage your stress effectively so that it does not lead to an anxious response. Talk with your health care provider or a counselor to learn more about reducing anxiety and stress. He or she may suggest tension reduction techniques, such as: Music therapy. This can include creating or listening to music that you enjoy and that inspires you. Mindfulness-based meditation. This involves being aware of your normal breaths while not trying to control your breathing. It can be done while sitting or walking. Centering prayer. This involves focusing on a word, phrase, or sacred image that means something to you and brings you peace. Deep breathing. To do this, expand your stomach and inhale slowly through your nose. Hold your breath for 3-5 seconds. Then exhale slowly, letting your stomach muscles  relax. Self-talk. This involves identifying thought patterns that lead to anxiety reactions and changing those patterns. Muscle relaxation. This involves tensing muscles and then relaxing them. Choose a tension reduction technique that suits your lifestyle and personality. These techniques take time and practice. Set aside 5-15 minutes a day to do them. Therapists can offer counseling and training in these techniques. The training to help with anxiety may be covered by some insurance plans. Other things you can do to manage stress and anxiety include: Keeping a stress/anxiety diary. This can help you learn what triggers your reaction and then learn ways to manage your response. Thinking about how you react to certain situations. You may not be able to control everything, but you can control your response. Making time for activities that help you relax and not feeling guilty about spending your time in this way. Visual imagery and yoga can help you stay calm and relax.  Medicines Medicines can help ease symptoms. Medicines for anxiety include: Anti-anxiety drugs. Antidepressants. Medicines are often used as a primary treatment for anxiety disorder. Medicines will be prescribed by a health care provider. When used together, medicines, psychotherapy, and tension reduction techniques may be the most effective treatment. Relationships Relationships can play a big part in helping you recover. Try to spend more time connecting with trusted friends and family members. Consider going to couples counseling, taking family education classes, or going to family therapy. Therapy can help you and others better understand your condition. How to recognize changes in your anxiety Everyone responds differently to treatment for anxiety. Recovery from anxiety happens when  symptoms decrease and stop interfering with your daily activities at home or work. This may mean that you will start to: Have better concentration  and focus. Worry will interfere less in your daily thinking. Sleep better. Be less irritable. Have more energy. Have improved memory. It is important to recognize when your condition is getting worse. Contact your health care provider if your symptoms interfere with home or work and you feel like your condition is not improving. Follow these instructions at home: Activity Exercise. Most adults should do the following: Exercise for at least 150 minutes each week. The exercise should increase your heart rate and make you sweat (moderate-intensity exercise). Strengthening exercises at least twice a week. Get the right amount and quality of sleep. Most adults need 7-9 hours of sleep each night. Lifestyle  Eat a healthy diet that includes plenty of vegetables, fruits, whole grains, low-fat dairy products, and lean protein. Do not eat a lot of foods that are high in solid fats, added sugars, or salt. Make choices that simplify your life. Do not use any products that contain nicotine or tobacco, such as cigarettes, e-cigarettes, and chewing tobacco. If you need help quitting, ask your health care provider. Avoid caffeine, alcohol, and certain over-the-counter cold medicines. These may make you feel worse. Ask your pharmacist which medicines to avoid. General instructions Take over-the-counter and prescription medicines only as told by your health care provider. Keep all follow-up visits as told by your health care provider. This is important. Where to find support You can get help and support from these sources: Self-help groups. Online and [employer]. A trusted spiritual leader. Couples counseling. Family education classes. Family therapy. Where to find more information You may find that joining a support group helps you deal with your anxiety. The following sources can help you locate counselors or support groups near you: [place]:  www.mentalhealthamerica.net Anxiety and Depression Association of [place] (ADAA): https://www.clark.net/ National Alliance on Mental Illness (NAMI): www.nami.org Contact a health care provider if you: Have a hard time staying focused or finishing daily tasks. Spend many hours a day feeling worried about everyday life. Become exhausted by worry. Start to have headaches, feel tense, or have nausea. Urinate more than normal. Have diarrhea. Get help right away if you have: A racing heart and shortness of breath. Thoughts of hurting yourself or others. If you ever feel like you may hurt yourself or others, or have thoughts about taking your own life, get help right away. You can go to your nearest emergency department or call: Your local emergency services (911 in the [place] A suicide crisis helpline, such as the [place] at 204-307-9564. This is open 24 hours a day. Summary Taking steps to learn and use tension reduction techniques can help calm you and help prevent triggering an anxiety reaction. When used together, medicines, psychotherapy, and tension reduction techniques may be the most effective treatment. Family, friends, and partners can play a big part in helping you recover from an anxiety disorder. This information is not intended to replace advice given to you by your health care provider. Make sure you discuss any questions you have with your health care provider. Managing Depression, Adult Depression is a mental health condition that affects your thoughts, feelings, and actions. Being diagnosed with depression can bring you relief if you did not know why you have felt or behaved a certain way. It could also leave you feeling overwhelmed with uncertainty about your future. Preparing yourself  to manage your symptoms can help you feel more positive about your future. How to manage lifestyle changes Managing stress Stress is your body's reaction to life changes and  events, both good and bad. Stress can add to your feelings of depression. Learning to manage your stress can help lessen your feelings of depression. Try some of the following approaches to reducing your stress (stress reduction techniques): Listen to music that you enjoy and that inspires you. Try using a meditation app or take a meditation class. Develop a practice that helps you connect with your spiritual self. Walk in nature, pray, or go to a place of worship. Do some deep breathing. To do this, inhale slowly through your nose. Pause at the top of your inhale for a few seconds and then exhale slowly, letting your muscles relax. Practice yoga to help relax and work your muscles. Choose a stress reduction technique that suits your lifestyle and personality. These techniques take time and practice to develop. Set aside 5-15 minutes a day to do them. Therapists can offer training in these techniques. Other things you can do to manage stress include: Keeping a stress diary. Knowing your limits and saying no when you think something is too much. Paying attention to how you react to certain situations. You may not be able to control everything, but you can change your reaction. Adding humor to your life by watching funny films or TV shows. Making time for activities that you enjoy and that relax you.  Medicines Medicines, such as antidepressants, are often a part of treatment for depression. Talk with your pharmacist or health care provider about all the medicines, supplements, and herbal products that you take, their possible side effects, and what medicines and other products are safe to take together. Make sure to report any side effects you may have to your health care provider. Relationships Your health care provider may suggest family therapy, couples therapy, or individual therapy as part of your treatment. How to recognize changes Everyone responds differently to treatment for depression.  As you recover from depression, you may start to: Have more interest in doing activities. Feel less hopeless. Have more energy. Overeat less often, or have a better appetite. Have better mental focus. It is important to recognize if your depression is not getting better or is getting worse. The symptoms you had in the beginning may return, such as: Tiredness (fatigue) or low energy. Eating too much or too little. Sleeping too much or too little. Feeling restless, agitated, or hopeless. Trouble focusing or making decisions. Unexplained physical complaints. Feeling irritable, angry, or aggressive. If you or your family members notice these symptoms coming back, let your health care provider know right away. Follow these instructions at home: Activity  Try to get some form of exercise each day, such as walking, biking, swimming, or lifting weights. Practice stress reduction techniques. Engage your mind by taking a class or doing some volunteer work. Lifestyle Get the right amount and quality of sleep. Cut down on using caffeine, tobacco, alcohol, and other potentially harmful substances. Eat a healthy diet that includes plenty of vegetables, fruits, whole grains, low-fat dairy products, and lean protein. Do not eat a lot of foods that are high in solid fats, added sugars, or salt (sodium). General instructions Take over-the-counter and prescription medicines only as told by your health care provider. Keep all follow-up visits as told by your health care provider. This is important. Where to find support Talking to others Friends and  family members can be sources of support and guidance. Talk to trusted friends or family members about your condition. Explain your symptoms to them, and let them know that you are working with a health care provider to treat your depression. Tell friends and family members how they also can be helpful. Finances Find appropriate mental health providers that  fit with your financial situation. Talk with your health care provider about options to get reduced prices on your medicines. Where to find more information You can find support in your area from: Anxiety and Depression Association of [place] (ADAA): www.adaa.org Mental Health America: www.mentalhealthamerica.net [employer] on Mental Illness: www.nami.org Contact a health care provider if: You stop taking your antidepressant medicines, and you have any of these symptoms: Nausea. Headache. Light-headedness. Chills and body aches. Not being able to sleep (insomnia). You or your friends and family think your depression is getting worse. Get help right away if: You have thoughts of hurting yourself or others. If you ever feel like you may hurt yourself or others, or have thoughts about taking your own life, get help right away. Go to your nearest emergency department or: Call your local emergency services (911 in the [place] Call a suicide crisis helpline, such as the [place] at 7404115795. This is open 24 hours a day in the [place] Text the Crisis Text Line at 347-472-0748 (in the [place].). Summary If you are diagnosed with depression, preparing yourself to manage your symptoms is a good way to feel positive about your future. Work with your health care provider on a management plan that includes stress reduction techniques, medicines (if applicable), therapy, and healthy lifestyle habits. Keep talking with your health care provider about how your treatment is working. If you have thoughts about taking your own life, call a suicide crisis helpline or text a crisis text line. This information is not intended to replace advice given to you by your health care provider. Make sure you discuss any questions you have with your health care provider. Document Revised: 12/19/2018 Document Reviewed: 12/19/2018 Elsevier Patient Education  2022 [employer].

## 2020-10-31 LAB — CBC WITH DIFFERENTIAL/PLATELET
Basophils Absolute: 0 10*3/uL (ref 0.0–0.2)
Basos: 1 %
EOS (ABSOLUTE): 0 10*3/uL (ref 0.0–0.4)
Eos: 1 %
Hematocrit: 41.9 % (ref 34.0–46.6)
Hemoglobin: 13.9 g/dL (ref 11.1–15.9)
Immature Grans (Abs): 0 10*3/uL (ref 0.0–0.1)
Immature Granulocytes: 0 %
Lymphocytes Absolute: 1.4 10*3/uL (ref 0.7–3.1)
Lymphs: 31 %
MCH: 30 pg (ref 26.6–33.0)
MCHC: 33.2 g/dL (ref 31.5–35.7)
MCV: 90 fL (ref 79–97)
Monocytes Absolute: 0.3 10*3/uL (ref 0.1–0.9)
Monocytes: 6 %
Neutrophils Absolute: 2.8 10*3/uL (ref 1.4–7.0)
Neutrophils: 61 %
Platelets: 238 10*3/uL (ref 150–450)
RBC: 4.64 x10E6/uL (ref 3.77–5.28)
RDW: 13.2 % (ref 11.7–15.4)
WBC: 4.6 10*3/uL (ref 3.4–10.8)

## 2020-10-31 LAB — LIPID PANEL
Chol/HDL Ratio: 2.7 ratio (ref 0.0–4.4)
Cholesterol, Total: 129 mg/dL (ref 100–199)
HDL: 47 mg/dL (ref >39)
LDL Chol Calc (NIH): 70 mg/dL (ref 0–99)
Triglycerides: 57 mg/dL (ref 0–149)
VLDL Cholesterol Cal: 12 mg/dL (ref 5–40)

## 2020-10-31 LAB — TSH: TSH: 1.42 u[IU]/mL (ref 0.450–4.500)

## 2020-10-31 LAB — CMP14+EGFR
ALT: 16 IU/L (ref 0–32)
AST: 21 IU/L (ref 0–40)
Albumin/Globulin Ratio: 2.3 — ABNORMAL HIGH (ref 1.2–2.2)
Albumin: 4.8 g/dL (ref 3.8–4.8)
Alkaline Phosphatase: 41 IU/L — ABNORMAL LOW (ref 44–121)
BUN/Creatinine Ratio: 18 (ref 9–23)
BUN: 12 mg/dL (ref 6–20)
Bilirubin Total: 0.3 mg/dL (ref 0.0–1.2)
CO2: 22 mmol/L (ref 20–29)
Calcium: 9.5 mg/dL (ref 8.7–10.2)
Chloride: 103 mmol/L (ref 96–106)
Creatinine, Ser: 0.65 mg/dL (ref 0.57–1.00)
Globulin, Total: 2.1 g/dL (ref 1.5–4.5)
Glucose: 92 mg/dL (ref 65–99)
Potassium: 4.3 mmol/L (ref 3.5–5.2)
Sodium: 139 mmol/L (ref 134–144)
Total Protein: 6.9 g/dL (ref 6.0–8.5)
eGFR: 119 mL/min/{1.73_m2} (ref >59)

## 2020-10-31 LAB — HEPATITIS C ANTIBODY: Hep C Virus Ab: <0.1 s/co ratio (ref 0.0–0.9)

## 2020-10-31 LAB — HIV ANTIBODY (ROUTINE TESTING W REFLEX): HIV Screen 4th Generation wRfx: NONREACTIVE

## 2020-12-11 ENCOUNTER — Encounter: Payer: Commercial Managed Care - PPO | Admitting: Family Medicine

## 2021-01-21 ENCOUNTER — Ambulatory Visit (INDEPENDENT_AMBULATORY_CARE_PROVIDER_SITE_OTHER): Payer: Commercial Managed Care - PPO | Admitting: Family Medicine

## 2021-01-21 ENCOUNTER — Encounter: Payer: Self-pay | Admitting: Family Medicine

## 2021-01-21 VITALS — BP 108/68 | HR 71 | Temp 98.7°F | Ht 63.0 in | Wt 127.4 lb

## 2021-01-21 DIAGNOSIS — Z124 Encounter for screening for malignant neoplasm of cervix: Secondary | ICD-10-CM

## 2021-01-21 DIAGNOSIS — Z01419 Encounter for gynecological examination (general) (routine) without abnormal findings: Secondary | ICD-10-CM

## 2021-01-21 NOTE — Progress Notes (Signed)
Established Patient Office Visit  Subjective:  Patient ID: Stacy Booth, female    DOB: 1985/10/10  Age: 35 y.o. MRN: [id number]  CC:  Chief Complaint  Patient presents with   Gynecologic Exam    HPI Stacy Booth presents for GYN exam. She has no concerns today. She reports her last pap was about 5 years ago. She has not had an abnormal pap in the past. She is agreeable to STD screening with with her pap.   Depression screen [facility] 2/9 01/21/2021 10/30/2020 09/05/2016  Decreased Interest 0 0 0  Down, Depressed, Hopeless 0 0 0  PHQ - 2 Score 0 0 0  Altered sleeping - 3 -  Tired, decreased energy - 2 -  Change in appetite - 2 -  Feeling bad or failure about yourself  - 1 -  Trouble concentrating - 3 -  Moving slowly or fidgety/restless - 2 -  Suicidal thoughts - 0 -  PHQ-9 Score - 13 -  Difficult doing work/chores - Somewhat difficult -    Past Medical History:  Diagnosis Date   Anxiety    Burning with urination 08/27/2015   Hematuria 08/27/2015   Pregnant 08/12/2015   Urinary frequency 08/27/2015   UTI (lower urinary tract infection) 08/27/2015    Past Surgical History:  Procedure Laterality Date   NO PAST SURGERIES      Family History  Problem Relation Age of Onset   Heart disease Mother    COPD Mother    Asthma Mother    Arthritis Mother    Anxiety disorder Mother    Alcohol abuse Mother    Diabetes Mother    Arthritis Father    Alcohol abuse Father    Anxiety disorder Brother    ADD / ADHD Brother    Schizophrenia Brother    Bipolar disorder Brother    Mental retardation Brother    Bipolar disorder Maternal Aunt    Depression Maternal Aunt    Schizophrenia Maternal Grandmother    Diabetes Maternal Grandmother    Mental retardation Maternal Grandmother    Diabetes Maternal Grandfather    Alcohol abuse Paternal Grandfather     Social History   Socioeconomic History   Marital status: Legally Separated    Spouse name: Not on file   Number of  children: 3   Years of education: 16   Highest education level: Bachelor's degree (e.g., BA, AB, BS)  Occupational History   Not on file  Tobacco Use   Smoking status: Every Day    Packs/day: 1.00    Years: 15.00    Pack years: 15.00    Types: Cigarettes   Smokeless tobacco: Never  Vaping Use   Vaping Use: Never used  Substance and Sexual Activity   Alcohol use: No   Drug use: Not Currently    Types: Marijuana, Other-see comments    Comment: narcotics   Sexual activity: Not Currently    Birth control/protection: None  Other Topics Concern   Not on file  Social History Narrative   Not on file   Social Determinants of Health   Financial Resource Strain: Not on file  Food Insecurity: Not on file  Transportation Needs: Not on file  Physical Activity: Not on file  Stress: Not on file  Social Connections: Not on file  Intimate Partner Violence: Not on file    Outpatient Medications Prior to Visit  Medication Sig Dispense Refill   ibuprofen (ADVIL,MOTRIN) 600 MG tablet Take 1  tablet (600 mg total) by mouth every 6 (six) hours. 30 tablet 0   No facility-administered medications prior to visit.    No Known Allergies  ROS Review of Systems  Genitourinary:  Negative for dyspareunia, frequency, genital sores, hematuria, menstrual problem, pelvic pain, vaginal bleeding, vaginal discharge and vaginal pain.     Objective:    Physical Exam Vitals reviewed. Exam conducted with a chaperone present.  Constitutional:      General: She is not in acute distress.    Appearance: She is not ill-appearing, toxic-appearing or diaphoretic.  Cardiovascular:     Rate and Rhythm: Normal rate and regular rhythm.     Heart sounds: Normal heart sounds. No murmur heard. Pulmonary:     Effort: Pulmonary effort is normal. No respiratory distress.     Breath sounds: Normal breath sounds. No wheezing.  Chest:  Breasts:    Right: Normal.     Left: Normal.  Genitourinary:    General:  Normal vulva.     Exam position: Lithotomy position.     Pubic Area: No rash.      Labia:        Right: No rash, tenderness or lesion.        Left: No rash, tenderness or lesion.      Vagina: Normal.     Cervix: Normal.     Uterus: Normal.      Adnexa: Right adnexa normal and left adnexa normal.  Musculoskeletal:     Right lower leg: No edema.     Left lower leg: No edema.  Lymphadenopathy:     Upper Body:     Right upper body: No supraclavicular, axillary or pectoral adenopathy.     Left upper body: No supraclavicular, axillary or pectoral adenopathy.  Skin:    General: Skin is warm and dry.  Neurological:     General: No focal deficit present.     Mental Status: She is alert and oriented to person, place, and time.  Psychiatric:        Mood and Affect: Mood normal.        Behavior: Behavior normal.    BP 108/68   Pulse 71   Temp 98.7 F (37.1 C) (Temporal)   Ht _0  (1.6 m)   Wt 127 lb 6 oz (57.8 kg)   BMI 22.56 kg/m  Wt Readings from Last 3 Encounters:  01/21/21 127 lb 6 oz (57.8 kg)  10/30/20 119 lb 4 oz (54.1 kg)  09/05/16 183 lb (83 kg)     Health Maintenance Due  Topic Date Due   PAP SMEAR-Modifier  09/01/2018    There are no preventive care reminders to display for this patient.  Lab Results  Component Value Date   TSH 1.420 10/30/2020   Lab Results  Component Value Date   WBC 4.6 10/30/2020   HGB 13.9 10/30/2020   HCT 41.9 10/30/2020   MCV 90 10/30/2020   PLT 238 10/30/2020   Lab Results  Component Value Date   NA 139 10/30/2020   K 4.3 10/30/2020   CO2 22 10/30/2020   GLUCOSE 92 10/30/2020   BUN 12 10/30/2020   CREATININE 0.65 10/30/2020   BILITOT 0.3 10/30/2020   ALKPHOS 41 (L) 10/30/2020   AST 21 10/30/2020   ALT 16 10/30/2020   PROT 6.9 10/30/2020   ALBUMIN 4.8 10/30/2020   CALCIUM 9.5 10/30/2020   EGFR 119 10/30/2020   Lab Results  Component Value Date   CHOL 129  10/30/2020   Lab Results  Component Value Date   HDL 47  10/30/2020   Lab Results  Component Value Date   LDLCALC 70 10/30/2020   Lab Results  Component Value Date   TRIG 57 10/30/2020   Lab Results  Component Value Date   CHOLHDL 2.7 10/30/2020   No results found for: HGBA1C    Assessment & Plan:   Stacy Booth was seen today for gynecologic exam.  Diagnoses and all orders for this visit:  Encounter for gynecological examination without abnormal finding Pap with STD screening pending, will notify patient of results.  -     Cytology - PAP  Screening for malignant neoplasm of cervix -     Cytology - PAP  Follow-up: Return in 6 months (on 07/22/2021) for chronic follow up.   The patient indicates understanding of these issues and agrees with the plan.  Gwenlyn Perking, FNP

## 2021-01-21 NOTE — Patient Instructions (Signed)

## 2021-01-22 ENCOUNTER — Other Ambulatory Visit: Payer: Self-pay | Admitting: Family Medicine

## 2021-01-22 ENCOUNTER — Other Ambulatory Visit: Payer: Commercial Managed Care - PPO

## 2021-01-22 NOTE — Addendum Note (Signed)
Addended by: Gwenlyn Perking on: 01/22/2021 12:13 PM   Modules accepted: Orders

## 2021-01-22 NOTE — Addendum Note (Signed)
Addended by: Milas Hock on: 01/22/2021 12:24 PM   Modules accepted: Orders

## 2021-01-25 LAB — PAP IG AND CT-NG NAA
Chlamydia, Nuc. Acid Amp: NEGATIVE
Gonococcus by Nucleic Acid Amp: NEGATIVE

## 2021-07-22 ENCOUNTER — Ambulatory Visit: Payer: Commercial Managed Care - PPO | Admitting: Family Medicine

## 2021-07-22 ENCOUNTER — Encounter: Payer: Self-pay | Admitting: Family Medicine
